# Patient Record
Sex: Male | Born: 1970
Health system: Southern US, Community
[De-identification: ages and names within clinical notes are randomized; demographics above are authoritative.]

## PROBLEM LIST (undated history)

## (undated) DIAGNOSIS — R011 Cardiac murmur, unspecified: Secondary | ICD-10-CM

## (undated) DIAGNOSIS — I10 Essential (primary) hypertension: Secondary | ICD-10-CM

## (undated) DIAGNOSIS — K219 Gastro-esophageal reflux disease without esophagitis: Secondary | ICD-10-CM

## (undated) DIAGNOSIS — T7840XA Allergy, unspecified, initial encounter: Secondary | ICD-10-CM

## (undated) HISTORY — DX: Allergy, unspecified, initial encounter: T78.40XA

---

## 2007-12-12 HISTORY — PX: COLONOSCOPY: SHX5424

## 2007-12-12 HISTORY — PX: UPPER GASTROINTESTINAL ENDOSCOPY: SHX188

## 2007-12-12 LAB — HM COLONOSCOPY

## 2011-04-26 ENCOUNTER — Ambulatory Visit: Payer: Self-pay | Admitting: Family Medicine

## 2012-12-26 LAB — LIPID PANEL
Cholesterol: 183 mg/dL (ref 0–200)
HDL: 46 mg/dL (ref 35–70)
LDL Cholesterol: 111 mg/dL
Triglycerides: 127 mg/dL (ref 40–160)

## 2014-07-12 LAB — PSA: PSA: 1

## 2014-07-12 LAB — TSH: TSH: 1.16 u[IU]/mL (ref <=5.90)

## 2015-04-20 ENCOUNTER — Encounter: Payer: Self-pay | Admitting: Internal Medicine

## 2015-04-20 DIAGNOSIS — K219 Gastro-esophageal reflux disease without esophagitis: Secondary | ICD-10-CM | POA: Insufficient documentation

## 2015-04-20 DIAGNOSIS — T7840XA Allergy, unspecified, initial encounter: Secondary | ICD-10-CM | POA: Insufficient documentation

## 2015-04-20 DIAGNOSIS — K589 Irritable bowel syndrome without diarrhea: Secondary | ICD-10-CM | POA: Insufficient documentation

## 2015-04-20 DIAGNOSIS — J3089 Other allergic rhinitis: Secondary | ICD-10-CM | POA: Insufficient documentation

## 2015-04-20 DIAGNOSIS — I10 Essential (primary) hypertension: Secondary | ICD-10-CM | POA: Insufficient documentation

## 2015-07-10 ENCOUNTER — Other Ambulatory Visit: Payer: Self-pay | Admitting: Internal Medicine

## 2015-07-16 ENCOUNTER — Other Ambulatory Visit: Payer: Self-pay | Admitting: Internal Medicine

## 2015-08-13 ENCOUNTER — Other Ambulatory Visit: Payer: Self-pay | Admitting: Internal Medicine

## 2015-12-01 ENCOUNTER — Other Ambulatory Visit: Payer: Self-pay | Admitting: Internal Medicine

## 2015-12-08 NOTE — Telephone Encounter (Signed)
pts coming in on 1/5

## 2015-12-15 ENCOUNTER — Ambulatory Visit: Payer: Self-pay | Admitting: Internal Medicine

## 2015-12-22 ENCOUNTER — Ambulatory Visit (INDEPENDENT_AMBULATORY_CARE_PROVIDER_SITE_OTHER): Payer: BLUE CROSS/BLUE SHIELD | Admitting: Internal Medicine

## 2015-12-22 ENCOUNTER — Encounter: Payer: Self-pay | Admitting: Internal Medicine

## 2015-12-22 VITALS — BP 136/82 | HR 88 | Ht 68.0 in | Wt 185.8 lb

## 2015-12-22 DIAGNOSIS — I10 Essential (primary) hypertension: Secondary | ICD-10-CM

## 2015-12-22 DIAGNOSIS — K219 Gastro-esophageal reflux disease without esophagitis: Secondary | ICD-10-CM | POA: Diagnosis not present

## 2015-12-22 MED ORDER — FLUTICASONE PROPIONATE 50 MCG/ACT NA SUSP: 2.0000 | Freq: Every day | NASAL | Status: DC

## 2015-12-22 MED ORDER — LOSARTAN POTASSIUM 50 MG PO TABS: 50.0000 mg | ORAL_TABLET | Freq: Every day | ORAL | Status: DC

## 2015-12-22 NOTE — Progress Notes (Signed)
Date:  12/22/2015   Name:  Jacob Blair   DOB:  1971-11-24   MRN:  [id number]   Chief Complaint: Follow-up and Hypertension Hypertension This is a chronic problem. The current episode started more than 1 year ago. The problem is unchanged. The problem is controlled. Pertinent negatives include no chest pain, headaches, palpitations or shortness of breath. Past treatments include angiotensin blockers and diuretics. The current treatment provides significant improvement. There are no compliance problems.    He checks his blood pressures intermittently.  He has a wellness visit at work yearly with labs.  That was done this past summer and was normal along with a normal blood pressure.  He feels generally well with no physical complaints. Reflux - symptoms are well controlled on daily proton pump inhibitor. He denies difficulty swallowing, heartburn, change in bowel habits or blood in the stool.  Review of Systems  Constitutional: Negative for fatigue.  Eyes: Negative for visual disturbance.  Respiratory: Negative for cough, chest tightness and shortness of breath.   Cardiovascular: Negative for chest pain, palpitations and leg swelling.  Gastrointestinal: Negative for abdominal pain and blood in stool.  Genitourinary: Negative for dysuria.  Musculoskeletal: Negative for arthralgias.  Allergic/Immunologic: Positive for environmental allergies.  Neurological: Negative for numbness and headaches.    Patient Active Problem List   Diagnosis Date Noted  . Allergic state 04/20/2015  . Essential (primary) hypertension 04/20/2015  . Gastro-esophageal reflux disease without esophagitis 04/20/2015  . Adaptive colitis 04/20/2015    Prior to Admission medications   Medication Sig Start Date End Date Taking? Authorizing Provider  Cetirizine HCl 10 MG CAPS Take 1 capsule by mouth daily.   Yes Historical Provider, MD  fluticasone (FLONASE) 50 MCG/ACT nasal spray Place 2 sprays into the nose  daily. 12/04/14  Yes Historical Provider, MD  hydrochlorothiazide (HYDRODIURIL) 25 MG tablet TAKE 1 TABLET BY MOUTH EVERY DAY 08/14/15  Yes Glean Hess, MD  losartan (COZAAR) 50 MG tablet TAKE 1 TABLET BY MOUTH DAILY 12/02/15  Yes Glean Hess, MD  Omeprazole 20 MG TBEC Take 1 tablet by mouth daily.   Yes Historical Provider, MD    Allergies  Allergen Reactions  . Ace Inhibitors Cough  . Peanut-Containing Drug Products   . Shellfish Allergy   . Amlodipine Rash    Past Surgical History  Procedure Laterality Date  . Upper gastrointestinal endoscopy  010209    small esophagus    Social History  Substance Use Topics  . Smoking status: Never Smoker   . Smokeless tobacco: Current User    Types: Chew  . Alcohol Use: None    Medication list has been reviewed and updated.   Physical Exam  Constitutional: He is oriented to person, place, and time. He appears well-developed and well-nourished. No distress.  HENT:  Head: Normocephalic and atraumatic.  Eyes: Conjunctivae are normal. Right eye exhibits no discharge. Left eye exhibits no discharge. No scleral icterus.  Neck: Normal range of motion. Neck supple.  Cardiovascular: Normal rate, regular rhythm and normal heart sounds.   Pulmonary/Chest: Effort normal and breath sounds normal. No respiratory distress.  Musculoskeletal: Normal range of motion. He exhibits no edema or tenderness.  Neurological: He is alert and oriented to person, place, and time.  Skin: Skin is warm and dry. No rash noted.  Psychiatric: He has a normal mood and affect. His behavior is normal. Thought content normal.    BP 136/82 mmHg  Pulse 88  Ht 5'  8" (1.727 m)  Wt 185 lb 12.8 oz (84.278 kg)  BMI 28.26 kg/m2  Assessment and Plan: 1. Essential (primary) hypertension Well-controlled - losartan (COZAAR) 50 MG tablet; Take 1 tablet (50 mg total) by mouth daily.  Dispense: 30 tablet; Refill: 5  2. Gastro-esophageal reflux disease without  esophagitis Stable on daily proton pump inhibitor   Halina Maidens, MD [facility]  12/22/2015

## 2016-01-29 ENCOUNTER — Other Ambulatory Visit: Payer: Self-pay | Admitting: Internal Medicine

## 2016-06-19 ENCOUNTER — Other Ambulatory Visit: Payer: Self-pay | Admitting: Internal Medicine

## 2016-06-20 ENCOUNTER — Encounter: Payer: Self-pay | Admitting: Internal Medicine

## 2016-06-26 DIAGNOSIS — H40003 Preglaucoma, unspecified, bilateral: Secondary | ICD-10-CM | POA: Diagnosis not present

## 2016-07-05 DIAGNOSIS — H40003 Preglaucoma, unspecified, bilateral: Secondary | ICD-10-CM | POA: Diagnosis not present

## 2016-07-23 ENCOUNTER — Ambulatory Visit (INDEPENDENT_AMBULATORY_CARE_PROVIDER_SITE_OTHER): Payer: BLUE CROSS/BLUE SHIELD | Admitting: Internal Medicine

## 2016-07-23 ENCOUNTER — Encounter: Payer: Self-pay | Admitting: Internal Medicine

## 2016-07-23 VITALS — BP 134/72 | HR 71 | Resp 16 | Ht 68.0 in | Wt 189.0 lb

## 2016-07-23 DIAGNOSIS — Z0000 Encounter for general adult medical examination without abnormal findings: Secondary | ICD-10-CM | POA: Diagnosis not present

## 2016-07-23 DIAGNOSIS — R5383 Other fatigue: Secondary | ICD-10-CM | POA: Insufficient documentation

## 2016-07-23 DIAGNOSIS — I10 Essential (primary) hypertension: Secondary | ICD-10-CM | POA: Diagnosis not present

## 2016-07-23 DIAGNOSIS — K219 Gastro-esophageal reflux disease without esophagitis: Secondary | ICD-10-CM

## 2016-07-23 DIAGNOSIS — Z125 Encounter for screening for malignant neoplasm of prostate: Secondary | ICD-10-CM | POA: Diagnosis not present

## 2016-07-23 LAB — POCT URINALYSIS DIPSTICK
Bilirubin, UA: NEGATIVE
Blood, UA: NEGATIVE
Glucose, UA: NEGATIVE
Ketones, UA: NEGATIVE
Leukocytes, UA: NEGATIVE
Nitrite, UA: NEGATIVE
Protein, UA: NEGATIVE
Spec Grav, UA: 1.01
pH, UA: 6

## 2016-07-23 NOTE — Patient Instructions (Signed)
DASH Eating Plan  DASH stands for "Dietary Approaches to Stop Hypertension." The DASH eating plan is a healthy eating plan that has been shown to reduce high blood pressure (hypertension). Additional health benefits may include reducing the risk of type 2 diabetes mellitus, heart disease, and stroke. The DASH eating plan may also help with weight loss.  WHAT DO I NEED TO KNOW ABOUT THE DASH EATING PLAN?  For the DASH eating plan, you will follow these general guidelines:  · Choose foods with a percent daily value for sodium of less than 5% (as listed on the food label).  · Use salt-free seasonings or herbs instead of table salt or sea salt.  · Check with your health care provider or pharmacist before using salt substitutes.  · Eat lower-sodium products, often labeled as "lower sodium" or "no salt added."  · Eat fresh foods.  · Eat more vegetables, fruits, and low-fat dairy products.  · Choose whole grains. Look for the word "whole" as the first word in the ingredient list.  · Choose fish and skinless chicken or turkey more often than red meat. Limit fish, poultry, and meat to 6 oz (170 g) each day.  · Limit sweets, desserts, sugars, and sugary drinks.  · Choose heart-healthy fats.  · Limit cheese to 1 oz (28 g) per day.  · Eat more home-cooked food and less restaurant, buffet, and fast food.  · Limit fried foods.  · Cook foods using methods other than frying.  · Limit canned vegetables. If you do use them, rinse them well to decrease the sodium.  · When eating at a restaurant, ask that your food be prepared with less salt, or no salt if possible.  WHAT FOODS CAN I EAT?  Seek help from a dietitian for individual calorie needs.  Grains  Whole grain or whole wheat bread. Brown rice. Whole grain or whole wheat pasta. Quinoa, bulgur, and whole grain cereals. Low-sodium cereals. Corn or whole wheat flour tortillas. Whole grain cornbread. Whole grain crackers. Low-sodium crackers.  Vegetables  Fresh or frozen vegetables  (raw, steamed, roasted, or grilled). Low-sodium or reduced-sodium tomato and vegetable juices. Low-sodium or reduced-sodium tomato sauce and paste. Low-sodium or reduced-sodium canned vegetables.   Fruits  All fresh, canned (in natural juice), or frozen fruits.  Meat and Other Protein Products  Ground beef (85% or leaner), grass-fed beef, or beef trimmed of fat. Skinless chicken or turkey. Ground chicken or turkey. Pork trimmed of fat. All fish and seafood. Eggs. Dried beans, peas, or lentils. Unsalted nuts and seeds. Unsalted canned beans.  Dairy  Low-fat dairy products, such as skim or 1% milk, 2% or reduced-fat cheeses, low-fat ricotta or cottage cheese, or plain low-fat yogurt. Low-sodium or reduced-sodium cheeses.  Fats and Oils  Tub margarines without trans fats. Light or reduced-fat mayonnaise and salad dressings (reduced sodium). Avocado. Safflower, olive, or canola oils. Natural peanut or almond butter.  Other  Unsalted popcorn and pretzels.  The items listed above may not be a complete list of recommended foods or beverages. Contact your dietitian for more options.  WHAT FOODS ARE NOT RECOMMENDED?  Grains  White bread. White pasta. White rice. Refined cornbread. Bagels and croissants. Crackers that contain trans fat.  Vegetables  Creamed or fried vegetables. Vegetables in a cheese sauce. Regular canned vegetables. Regular canned tomato sauce and paste. Regular tomato and vegetable juices.  Fruits  Dried fruits. Canned fruit in light or heavy syrup. Fruit juice.  Meat and Other Protein   Products  Fatty cuts of meat. Ribs, chicken wings, bacon, sausage, bologna, salami, chitterlings, fatback, hot dogs, bratwurst, and packaged luncheon meats. Salted nuts and seeds. Canned beans with salt.  Dairy  Whole or 2% milk, cream, half-and-half, and cream cheese. Whole-fat or sweetened yogurt. Full-fat cheeses or blue cheese. Nondairy creamers and whipped toppings. Processed cheese, cheese spreads, or cheese  curds.  Condiments  Onion and garlic salt, seasoned salt, table salt, and sea salt. Canned and packaged gravies. Worcestershire sauce. Tartar sauce. Barbecue sauce. Teriyaki sauce. Soy sauce, including reduced sodium. Steak sauce. Fish sauce. Oyster sauce. Cocktail sauce. Horseradish. Ketchup and mustard. Meat flavorings and tenderizers. Bouillon cubes. Hot sauce. Tabasco sauce. Marinades. Taco seasonings. Relishes.  Fats and Oils  Butter, stick margarine, lard, shortening, ghee, and bacon fat. Coconut, palm kernel, or palm oils. Regular salad dressings.  Other  Pickles and olives. Salted popcorn and pretzels.  The items listed above may not be a complete list of foods and beverages to avoid. Contact your dietitian for more information.  WHERE CAN I FIND MORE INFORMATION?  National Heart, Lung, and Blood Institute: www.nhlbi.nih.gov/health/health-topics/topics/dash/     This information is not intended to replace advice given to you by your health care provider. Make sure you discuss any questions you have with your health care provider.     Document Released: 11/15/2011 Document Revised: 12/17/2014 Document Reviewed: 09/30/2013  Elsevier Interactive Patient Education ©2016 Elsevier Inc[employer]

## 2016-07-23 NOTE — Progress Notes (Signed)
Date:  07/23/2016   Name:  Jacob Blair   DOB:  September 26, 1971   MRN:  [id number]   Chief Complaint: Annual Exam; Fatigue; and Hypertension Jacob Blair is a 45 y.o. male who presents today for his Complete Annual Exam. He feels well. He reports exercising regularly. He reports he is sleeping well.   Hypertension  This is a chronic problem. The current episode started more than 1 year ago. The problem is unchanged. The problem is controlled. Pertinent negatives include no chest pain, headaches, palpitations or shortness of breath. Past treatments include angiotensin blockers. The current treatment provides significant improvement.      Review of Systems  Constitutional: Positive for activity change (decreased stamina without other sx). Negative for appetite change, chills, diaphoresis, fatigue and unexpected weight change.  HENT: Negative for hearing loss, tinnitus, trouble swallowing and voice change.   Eyes: Negative for visual disturbance.  Respiratory: Negative for choking, shortness of breath and wheezing.   Cardiovascular: Negative for chest pain, palpitations and leg swelling.  Gastrointestinal: Positive for constipation and diarrhea. Negative for abdominal pain and blood in stool.  Genitourinary: Negative for decreased urine volume, difficulty urinating, dysuria, frequency and urgency.  Musculoskeletal: Negative for arthralgias, back pain and myalgias.  Skin: Negative for color change and rash.  Neurological: Negative for dizziness, syncope, weakness, light-headedness and headaches.  Hematological: Negative for adenopathy.  Psychiatric/Behavioral: Negative for dysphoric mood and sleep disturbance. The patient is not nervous/anxious.     Patient Active Problem List   Diagnosis Date Noted  . Allergic state 04/20/2015  . Essential (primary) hypertension 04/20/2015  . Gastro-esophageal reflux disease without esophagitis 04/20/2015  . Adaptive colitis 04/20/2015    Prior to  Admission medications   Medication Sig Start Date End Date Taking? Authorizing Provider  fluticasone (FLONASE) 50 MCG/ACT nasal spray Place 2 sprays into both nostrils daily. 12/22/15  Yes Glean Hess, MD  hydrochlorothiazide (HYDRODIURIL) 25 MG tablet TAKE 1 TABLET BY MOUTH EVERY DAY 01/29/16  Yes Glean Hess, MD  losartan (COZAAR) 50 MG tablet TAKE 1 TABLET(50 MG) BY MOUTH DAILY 06/19/16  Yes Glean Hess, MD  Omeprazole 20 MG TBEC Take 1 tablet by mouth daily.   Yes Historical Provider, MD  Cetirizine HCl 10 MG CAPS Take 1 capsule by mouth daily.    Historical Provider, MD    Allergies  Allergen Reactions  . Ace Inhibitors Cough  . Peanut-Containing Drug Products   . Shellfish Allergy   . Amlodipine Rash    Past Surgical History:  Procedure Laterality Date  . UPPER GASTROINTESTINAL ENDOSCOPY  010209   small esophagus    Social History  Substance Use Topics  . Smoking status: Never Smoker  . Smokeless tobacco: Current User    Types: Chew  . Alcohol use Not on file     Medication list has been reviewed and updated.   Physical Exam  Constitutional: He is oriented to person, place, and time. He appears well-developed and well-nourished.  HENT:  Head: Normocephalic.  Right Ear: Tympanic membrane, external ear and ear canal normal.  Left Ear: Tympanic membrane, external ear and ear canal normal.  Nose: Nose normal.  Mouth/Throat: Uvula is midline and oropharynx is clear and moist.  Eyes: Conjunctivae and EOM are normal. Pupils are equal, round, and reactive to light.  Neck: Normal range of motion. Neck supple. Carotid bruit is not present. No thyromegaly present.  Cardiovascular: Normal rate, regular rhythm, normal heart sounds and intact  distal pulses.   Pulmonary/Chest: Effort normal and breath sounds normal. No respiratory distress. He has no wheezes. Right breast exhibits no mass. Left breast exhibits no mass.  Abdominal: Soft. Normal appearance and bowel  sounds are normal. There is no hepatosplenomegaly. There is no tenderness.  Musculoskeletal: Normal range of motion. He exhibits no edema or tenderness.  Lymphadenopathy:    He has no cervical adenopathy.  Neurological: He is alert and oriented to person, place, and time. He has normal reflexes.  Skin: Skin is warm, dry and intact.  Psychiatric: He has a normal mood and affect. His speech is normal and behavior is normal. Judgment and thought content normal.  Nursing note and vitals reviewed.   BP 134/72   Pulse 71   Resp 16   Ht 5\' 8"  (1.727 m)   Wt 189 lb (85.7 kg)   SpO2 100%   BMI 28.74 kg/m   Assessment and Plan: 1. Annual physical exam Advise if medication is needed Recommend low carb diet to aid in weight loss/control - Lipid panel  2. Essential (primary) hypertension controlled - CBC with Differential/Platelet - Comprehensive metabolic panel - TSH - POCT urinalysis dipstick  3. Gastro-esophageal reflux disease without esophagitis Stable on PPI  4. Decreased stamina Check testosterone levels - Testosterone   Halina Maidens, MD [facility]  07/23/2016

## 2016-07-24 ENCOUNTER — Encounter: Payer: Self-pay | Admitting: Internal Medicine

## 2016-07-24 DIAGNOSIS — E785 Hyperlipidemia, unspecified: Secondary | ICD-10-CM | POA: Insufficient documentation

## 2016-07-24 DIAGNOSIS — E7800 Pure hypercholesterolemia, unspecified: Secondary | ICD-10-CM | POA: Insufficient documentation

## 2016-07-24 DIAGNOSIS — R7989 Other specified abnormal findings of blood chemistry: Secondary | ICD-10-CM | POA: Insufficient documentation

## 2016-07-24 LAB — COMPREHENSIVE METABOLIC PANEL
ALT: 34 IU/L (ref 0–44)
AST: 24 IU/L (ref 0–40)
Albumin/Globulin Ratio: 1.8 (ref 1.2–2.2)
Albumin: 4.8 g/dL (ref 3.5–5.5)
Alkaline Phosphatase: 81 IU/L (ref 39–117)
BUN/Creatinine Ratio: 16 (ref 9–20)
BUN: 14 mg/dL (ref 6–24)
Bilirubin Total: 0.7 mg/dL (ref 0.0–1.2)
CO2: 24 mmol/L (ref 18–29)
Calcium: 9.6 mg/dL (ref 8.7–10.2)
Chloride: 98 mmol/L (ref 96–106)
Creatinine, Ser: 0.89 mg/dL (ref 0.76–1.27)
GFR calc Af Amer: 119 mL/min/{1.73_m2} (ref >59)
GFR calc non Af Amer: 103 mL/min/{1.73_m2} (ref >59)
Globulin, Total: 2.7 g/dL (ref 1.5–4.5)
Glucose: 97 mg/dL (ref 65–99)
Potassium: 3.9 mmol/L (ref 3.5–5.2)
Sodium: 140 mmol/L (ref 134–144)
Total Protein: 7.5 g/dL (ref 6.0–8.5)

## 2016-07-24 LAB — LIPID PANEL
Chol/HDL Ratio: 3.7 ratio units (ref 0.0–5.0)
Cholesterol, Total: 208 mg/dL — ABNORMAL HIGH (ref 100–199)
HDL: 56 mg/dL (ref >39)
LDL Calculated: 131 mg/dL — ABNORMAL HIGH (ref 0–99)
Triglycerides: 106 mg/dL (ref 0–149)
VLDL Cholesterol Cal: 21 mg/dL (ref 5–40)

## 2016-07-24 LAB — PSA: Prostate Specific Ag, Serum: 1.2 ng/mL (ref 0.0–4.0)

## 2016-07-24 LAB — CBC WITH DIFFERENTIAL/PLATELET
Basophils Absolute: 0 10*3/uL (ref 0.0–0.2)
Basos: 0 %
EOS (ABSOLUTE): 0.2 10*3/uL (ref 0.0–0.4)
Eos: 2 %
Hematocrit: 40.8 % (ref 37.5–51.0)
Hemoglobin: 13.6 g/dL (ref 12.6–17.7)
Immature Grans (Abs): 0 10*3/uL (ref 0.0–0.1)
Immature Granulocytes: 0 %
Lymphocytes Absolute: 2 10*3/uL (ref 0.7–3.1)
Lymphs: 26 %
MCH: 30.5 pg (ref 26.6–33.0)
MCHC: 33.3 g/dL (ref 31.5–35.7)
MCV: 92 fL (ref 79–97)
Monocytes Absolute: 0.6 10*3/uL (ref 0.1–0.9)
Monocytes: 8 %
Neutrophils Absolute: 4.9 10*3/uL (ref 1.4–7.0)
Neutrophils: 64 %
Platelets: 333 10*3/uL (ref 150–379)
RBC: 4.46 x10E6/uL (ref 4.14–5.80)
RDW: 13.5 % (ref 12.3–15.4)
WBC: 7.6 10*3/uL (ref 3.4–10.8)

## 2016-07-24 LAB — TESTOSTERONE: Testosterone: 252 ng/dL — ABNORMAL LOW (ref 264–916)

## 2016-07-24 LAB — TSH: TSH: 1.4 u[IU]/mL (ref 0.450–4.500)

## 2016-07-25 ENCOUNTER — Other Ambulatory Visit: Payer: Self-pay | Admitting: Internal Medicine

## 2016-07-25 ENCOUNTER — Telehealth: Payer: Self-pay

## 2016-07-25 DIAGNOSIS — R7989 Other specified abnormal findings of blood chemistry: Secondary | ICD-10-CM

## 2016-07-25 NOTE — Telephone Encounter (Signed)
Patient wants to know if he can now do the 2nd lab required for his Testosterone supplement to be covered? aIn note you mention IF he wants to continue these we will do lab but he has never started it.

## 2016-07-25 NOTE — Telephone Encounter (Signed)
I will order a repeat testosterone level.  He should do it before 10 AM.

## 2016-07-26 ENCOUNTER — Telehealth: Payer: Self-pay

## 2016-08-01 ENCOUNTER — Other Ambulatory Visit: Payer: Self-pay | Admitting: Internal Medicine

## 2016-08-01 DIAGNOSIS — R7989 Other specified abnormal findings of blood chemistry: Secondary | ICD-10-CM

## 2016-08-01 DIAGNOSIS — E291 Testicular hypofunction: Secondary | ICD-10-CM | POA: Diagnosis not present

## 2016-08-01 NOTE — Telephone Encounter (Signed)
complete

## 2016-08-02 LAB — TESTOSTERONE: Testosterone: 248 ng/dL — ABNORMAL LOW (ref 264–916)

## 2016-08-07 ENCOUNTER — Ambulatory Visit (INDEPENDENT_AMBULATORY_CARE_PROVIDER_SITE_OTHER): Payer: BLUE CROSS/BLUE SHIELD | Admitting: Internal Medicine

## 2016-08-07 ENCOUNTER — Encounter: Payer: Self-pay | Admitting: Internal Medicine

## 2016-08-07 ENCOUNTER — Other Ambulatory Visit: Payer: Self-pay | Admitting: Internal Medicine

## 2016-08-07 VITALS — BP 136/82 | HR 72 | Wt 184.2 lb

## 2016-08-07 DIAGNOSIS — E291 Testicular hypofunction: Secondary | ICD-10-CM

## 2016-08-07 DIAGNOSIS — R7989 Other specified abnormal findings of blood chemistry: Secondary | ICD-10-CM

## 2016-08-07 MED ORDER — TESTOSTERONE 50 MG/5GM (1%) TD GEL: 5.0000 g | Freq: Every day | TRANSDERMAL | 5 refills | Status: DC

## 2016-08-07 MED ORDER — TESTOSTERONE 20.25 MG/ACT (1.62%) TD GEL: 2.0000 "application " | Freq: Every day | TRANSDERMAL | 5 refills | Status: DC

## 2016-08-07 NOTE — Progress Notes (Signed)
    Date:  08/07/2016   Name:  Jacob Blair   DOB:  [birth year]   MRN:  [id number]   Chief Complaint: No chief complaint on file. Low testosterone - some low energy and decreased stamina but no depression or decreased libido.  Testosterone levels were low x 2.  He would like to try supplementation.  Last lipids, CBC and PSA were normal.   Review of Systems  Constitutional: Negative for unexpected weight change (has lost 5 lbs with effort).  Respiratory: Negative for chest tightness and shortness of breath.   Cardiovascular: Negative for chest pain, palpitations and leg swelling.  Neurological: Negative for dizziness and headaches.  Hematological: Does not bruise/bleed easily.  All other systems reviewed and are negative.   Patient Active Problem List   Diagnosis Date Noted  . Hyperlipidemia, mild 07/24/2016  . Low serum testosterone level 07/24/2016  . Decreased stamina 07/23/2016  . Allergic state 04/20/2015  . Essential (primary) hypertension 04/20/2015  . Gastro-esophageal reflux disease without esophagitis 04/20/2015  . Adaptive colitis 04/20/2015    Prior to Admission medications   Medication Sig Start Date End Date Taking? Authorizing Provider  Cetirizine HCl 10 MG CAPS Take 1 capsule by mouth daily.   Yes Historical Provider, MD  fluticasone (FLONASE) 50 MCG/ACT nasal spray Place 2 sprays into both nostrils daily. 12/22/15  Yes Glean Hess, MD  hydrochlorothiazide (HYDRODIURIL) 25 MG tablet TAKE 1 TABLET BY MOUTH EVERY DAY 01/29/16  Yes Glean Hess, MD  losartan (COZAAR) 50 MG tablet TAKE 1 TABLET(50 MG) BY MOUTH DAILY 06/19/16  Yes Glean Hess, MD  Omeprazole 20 MG TBEC Take 1 tablet by mouth daily.   Yes Historical Provider, MD    Allergies  Allergen Reactions  . Ace Inhibitors Cough  . Peanut-Containing Drug Products   . Shellfish Allergy   . Amlodipine Rash    Past Surgical History:  Procedure Laterality Date  . UPPER GASTROINTESTINAL ENDOSCOPY   010209   small esophagus    Social History  Substance Use Topics  . Smoking status: Never Smoker  . Smokeless tobacco: Current User    Types: Chew  . Alcohol use Not on file     Medication list has been reviewed and updated.   Physical Exam  Constitutional: He is oriented to person, place, and time. He appears well-developed. No distress.  HENT:  Head: Normocephalic and atraumatic.  Neck: Normal range of motion. Neck supple.  Cardiovascular: Normal rate, regular rhythm and normal heart sounds.   Pulmonary/Chest: Effort normal and breath sounds normal. No respiratory distress.  Musculoskeletal: Normal range of motion.  Neurological: He is alert and oriented to person, place, and time.  Skin: Skin is warm and dry. No rash noted.  Psychiatric: He has a normal mood and affect. His behavior is normal. Thought content normal.  Nursing note and vitals reviewed.   BP 136/82   Pulse 72   Wt 184 lb 3.2 oz (83.6 kg)   BMI 28.01 kg/m   Assessment and Plan: 1. Low serum testosterone level Begin androgel pump - 2 applications daily Expect PA F/u 4 months - Testosterone 20.25 MG/ACT (1.62%) GEL; Place 2 application onto the skin daily.  Dispense: 150 g; Refill: [place], MD [facility]  08/07/2016

## 2016-08-15 ENCOUNTER — Encounter: Payer: Self-pay | Admitting: Internal Medicine

## 2016-08-16 ENCOUNTER — Other Ambulatory Visit: Payer: Self-pay

## 2016-10-15 ENCOUNTER — Other Ambulatory Visit: Payer: Self-pay | Admitting: Internal Medicine

## 2016-10-15 ENCOUNTER — Telehealth: Payer: Self-pay

## 2016-10-15 MED ORDER — OSELTAMIVIR PHOSPHATE 75 MG PO CAPS: 75.0000 mg | ORAL_CAPSULE | Freq: Every day | ORAL | 0 refills | Status: DC

## 2016-10-15 NOTE — Telephone Encounter (Signed)
Advised 

## 2016-10-15 NOTE — Telephone Encounter (Signed)
Wife DX Flu in [place] yesterday. They have a trip planned later in week and advised him to call us for Tamiflu incase he gets it later. Wife is 48 hours sick. Would like a call back if Tamiflu can be sent. No symptoms yet.

## 2016-10-15 NOTE — Telephone Encounter (Signed)
Sent to pharmacy 

## 2016-11-06 ENCOUNTER — Encounter: Payer: Self-pay | Admitting: Internal Medicine

## 2016-12-07 ENCOUNTER — Ambulatory Visit (INDEPENDENT_AMBULATORY_CARE_PROVIDER_SITE_OTHER): Payer: BLUE CROSS/BLUE SHIELD | Admitting: Internal Medicine

## 2016-12-07 ENCOUNTER — Encounter: Payer: Self-pay | Admitting: Internal Medicine

## 2016-12-07 VITALS — BP 130/84 | HR 76 | Temp 98.7°F | Ht 68.0 in | Wt 181.0 lb

## 2016-12-07 DIAGNOSIS — E291 Testicular hypofunction: Secondary | ICD-10-CM

## 2016-12-07 DIAGNOSIS — I10 Essential (primary) hypertension: Secondary | ICD-10-CM

## 2016-12-07 DIAGNOSIS — R7989 Other specified abnormal findings of blood chemistry: Secondary | ICD-10-CM

## 2016-12-07 NOTE — Progress Notes (Signed)
Date:  12/07/2016   Name:  Jacob Blair   DOB:  March 17, 1971   MRN:  [id number]   Chief Complaint: No chief complaint on file. Follow up on testosterone. Patient did not continue testosterone after the first 2 months. He did not think that there was any benefit to its use and as of the first of the year have to pay full price. His last use of testosterone was about 3 weeks ago. He has been working on his diet and has lost some weight. He feels a little better with more energy and he is not sure if he can attribute this to the improved diet and weight loss or to testosterone. He is considering vasectomy and wonders about Urology recommendations and if he needs a referral.    Review of Systems  Constitutional: Negative for diaphoresis, fatigue, fever and unexpected weight change.  Respiratory: Negative for cough, chest tightness and shortness of breath.   Cardiovascular: Negative for chest pain, palpitations and leg swelling.  Gastrointestinal: Negative for abdominal pain.  Genitourinary: Negative for frequency and urgency.    Patient Active Problem List   Diagnosis Date Noted  . Hyperlipidemia, mild 07/24/2016  . Low serum testosterone level 07/24/2016  . Decreased stamina 07/23/2016  . Allergic state 04/20/2015  . Essential (primary) hypertension 04/20/2015  . Gastro-esophageal reflux disease without esophagitis 04/20/2015  . Adaptive colitis 04/20/2015    Prior to Admission medications   Medication Sig Start Date End Date Taking? Authorizing Provider  Cetirizine HCl 10 MG CAPS Take 1 capsule by mouth daily.    Historical Provider, MD  fluticasone (FLONASE) 50 MCG/ACT nasal spray Place 2 sprays into both nostrils daily. 12/22/15   Glean Hess, MD  hydrochlorothiazide (HYDRODIURIL) 25 MG tablet TAKE 1 TABLET BY MOUTH EVERY DAY 01/29/16   Glean Hess, MD  losartan (COZAAR) 50 MG tablet TAKE 1 TABLET(50 MG) BY MOUTH DAILY 06/19/16   Glean Hess, MD  Omeprazole 20 MG  TBEC Take 1 tablet by mouth daily.    Historical Provider, MD  testosterone (TESTIM) 50 MG/5GM (1%) GEL Place 5 g onto the skin daily. Patient not taking: Reported on 12/07/2016 08/07/16   Glean Hess, MD    Allergies  Allergen Reactions  . Ace Inhibitors Cough  . Peanut-Containing Drug Products   . Shellfish Allergy   . Amlodipine Rash    Past Surgical History:  Procedure Laterality Date  . UPPER GASTROINTESTINAL ENDOSCOPY  010209   small esophagus    Social History  Substance Use Topics  . Smoking status: Never Smoker  . Smokeless tobacco: Current User    Types: Chew  . Alcohol use Not on file     Medication list has been reviewed and updated.   Physical Exam  Constitutional: He is oriented to person, place, and time. He appears well-developed. No distress.  HENT:  Head: Normocephalic and atraumatic.  Pulmonary/Chest: Effort normal. No respiratory distress.  Musculoskeletal: Normal range of motion.  Neurological: He is alert and oriented to person, place, and time.  Skin: Skin is warm and dry. No rash noted.  Psychiatric: He has a normal mood and affect. His behavior is normal. Thought content normal.  Nursing note and vitals reviewed.   BP 130/84   Pulse 76   Temp 98.7 F (37.1 C)   Ht 5\' 8"  (1.727 m)   Wt 181 lb (82.1 kg)   SpO2 99%   BMI 27.52 kg/m   Assessment and Plan:  1. Low serum testosterone level Consider restarting testosterone at 1.5 packets per day and recheck labs in 2 months (depending on cost)  2. Essential (primary) hypertension Controlled, continue current medication.   Halina Maidens, MD [facility]  12/07/2016

## 2017-01-09 ENCOUNTER — Other Ambulatory Visit: Payer: Self-pay | Admitting: Internal Medicine

## 2017-03-27 DIAGNOSIS — L282 Other prurigo: Secondary | ICD-10-CM | POA: Diagnosis not present

## 2017-07-10 ENCOUNTER — Other Ambulatory Visit: Payer: Self-pay | Admitting: Internal Medicine

## 2017-07-24 ENCOUNTER — Encounter: Payer: Self-pay | Admitting: Internal Medicine

## 2017-07-27 ENCOUNTER — Ambulatory Visit
Admission: EM | Admit: 2017-07-27 | Discharge: 2017-07-27 | Disposition: A | Discharge status: Home / Self Care | Payer: BLUE CROSS/BLUE SHIELD | Attending: Family Medicine | Admitting: Family Medicine

## 2017-07-27 ENCOUNTER — Encounter: Payer: Self-pay | Admitting: Gynecology

## 2017-07-27 DIAGNOSIS — R1031 Right lower quadrant pain: Secondary | ICD-10-CM

## 2017-07-27 HISTORY — DX: Essential (primary) hypertension: I10

## 2017-07-27 LAB — URINALYSIS, COMPLETE (UACMP) WITH MICROSCOPIC
Bacteria, UA: NONE SEEN
Bilirubin Urine: NEGATIVE
Glucose, UA: NEGATIVE mg/dL
Hgb urine dipstick: NEGATIVE
Ketones, ur: NEGATIVE mg/dL
Leukocytes, UA: NEGATIVE
Nitrite: NEGATIVE
Protein, ur: NEGATIVE mg/dL
Specific Gravity, Urine: 1.02 (ref 1.005–1.030)
WBC, UA: NONE SEEN WBC/hpf (ref 0–5)
pH: 7 (ref 5.0–8.0)

## 2017-07-27 LAB — CBC
HCT: 43.9 % (ref 40.0–52.0)
Hemoglobin: 15 g/dL (ref 13.0–18.0)
MCH: 31.4 pg (ref 26.0–34.0)
MCHC: 34.3 g/dL (ref 32.0–36.0)
MCV: 91.7 fL (ref 80.0–100.0)
Platelets: 307 10*3/uL (ref 150–440)
RBC: 4.79 MIL/uL (ref 4.40–5.90)
RDW: 13 % (ref 11.5–14.5)
WBC: 8.8 10*3/uL (ref 3.8–10.6)

## 2017-07-27 LAB — BASIC METABOLIC PANEL
Anion gap: 8 (ref 5–15)
BUN: 13 mg/dL (ref 6–20)
CO2: 27 mmol/L (ref 22–32)
Calcium: 10.2 mg/dL (ref 8.9–10.3)
Chloride: 102 mmol/L (ref 101–111)
Creatinine, Ser: 0.86 mg/dL (ref 0.61–1.24)
GFR calc Af Amer: >60 mL/min (ref >60)
GFR calc non Af Amer: >60 mL/min (ref >60)
Glucose, Bld: 93 mg/dL (ref 65–99)
Potassium: 3.9 mmol/L (ref 3.5–5.1)
Sodium: 137 mmol/L (ref 135–145)

## 2017-07-27 NOTE — ED Triage Notes (Signed)
Patient c/o nagging pain at RLQ on and off x 2 weeks.

## 2017-07-27 NOTE — ED Provider Notes (Signed)
[facility]    CSN: [id number] Arrival date & time: 07/27/17  1419     History   Chief Complaint Chief Complaint  Patient presents with  . Abdominal Pain    HPI Jacob Blair is a 46 y.o. male persistence to the urgent care facility for evaluation of right lower quadrant abdominal pain. His pain isn't present for 2 weeks he describes a mild dull ache in the right lower quadrant that he describes as pressure. Pain is been off and on for 2 weeks. No issues with bowel or bladder. Normal bowel movements. Today, patient experienced some mild sharp pain with walking. Pain is currently 0 out of 10. He denies any back pain or radiation of pain. No fevers, nausea, vomiting. No blood in his stool. He is currently tolerating by mouth well. He has a history of IBS that controls this with his diet. Last saw gastroenterology 9 years ago.    HPI  Past Medical History:  Diagnosis Date  . Hypertension     Patient Active Problem List   Diagnosis Date Noted  . Hyperlipidemia, mild 07/24/2016  . Low serum testosterone level 07/24/2016  . Decreased stamina 07/23/2016  . Allergic state 04/20/2015  . Essential (primary) hypertension 04/20/2015  . Gastro-esophageal reflux disease without esophagitis 04/20/2015  . Adaptive colitis 04/20/2015    Past Surgical History:  Procedure Laterality Date  . UPPER GASTROINTESTINAL ENDOSCOPY  010209   small esophagus       Home Medications    Prior to Admission medications   Medication Sig Start Date End Date Taking? Authorizing Provider  Cetirizine HCl 10 MG CAPS Take 1 capsule by mouth daily.   Yes [provider]  fluticasone (FLONASE) 50 MCG/ACT nasal spray Place 2 sprays into both nostrils daily. 12/22/15  Yes Glean Hess, MD  hydrochlorothiazide (HYDRODIURIL) 25 MG tablet TAKE 1 TABLET BY MOUTH EVERY DAY 01/10/17  Yes Glean Hess, MD  losartan (COZAAR) 50 MG tablet TAKE 1 TABLET(50 MG) BY MOUTH DAILY 07/11/17  Yes  Glean Hess, MD  Omeprazole 20 MG TBEC Take 1 tablet by mouth daily.   Yes [provider]  testosterone (TESTIM) 50 MG/5GM (1%) GEL Place 5 g onto the skin daily. Patient not taking: Reported on 12/07/2016 08/07/16   Glean Hess, MD    Family History Family History  Problem Relation Age of Onset  . Adopted: Yes  . Family history unknown: Yes    Social History Social History  Substance Use Topics  . Smoking status: Never Smoker  . Smokeless tobacco: Current User    Types: Chew  . Alcohol use 0.0 oz/week     Allergies   Ace inhibitors; Peanut-containing drug products; Shellfish allergy; and Amlodipine   Review of Systems Review of Systems  Constitutional: Negative for fever.  HENT: Negative for trouble swallowing.   Respiratory: Negative for shortness of breath.   Cardiovascular: Negative for chest pain.  Gastrointestinal: Positive for abdominal pain. Negative for abdominal distention, constipation, diarrhea, nausea, rectal pain and vomiting.  Genitourinary: Negative for difficulty urinating, dysuria and frequency.  Skin: Negative for rash.     Physical Exam Triage Vital Signs ED Triage Vitals  Enc Vitals Group     BP 07/27/17 1431 (!) 154/85     Pulse Rate 07/27/17 1431 82     Resp 07/27/17 1431 16     Temp 07/27/17 1431 98.4 F (36.9 C)     Temp Source 07/27/17 1431 Oral  SpO2 07/27/17 1431 100 %     Weight 07/27/17 1435 179 lb (81.2 kg)     Height 07/27/17 1435 5\' 8"  (1.727 m)     Head Circumference --      Peak Flow --      Pain Score 07/27/17 1435 3     Pain Loc --      Pain Edu? --      Excl. in [place]? --    No data found.   Updated Vital Signs BP (!) 154/85 (BP Location: Left Arm)   Pulse 82   Temp 98.4 F (36.9 C) (Oral)   Resp 16   Ht 5\' 8"  (1.727 m)   Wt 179 lb (81.2 kg)   SpO2 100%   BMI 27.22 kg/m   Visual Acuity Right Eye Distance:   Left Eye Distance:   Bilateral Distance:    Right Eye Near:   Left Eye  Near:    Bilateral Near:     Physical Exam  Constitutional: He is oriented to person, place, and time. He appears well-developed and well-nourished.  HENT:  Head: Normocephalic and atraumatic.  Eyes: Conjunctivae are normal.  Neck: Neck supple.  Cardiovascular: Normal rate and regular rhythm.   No murmur heard. Pulmonary/Chest: Effort normal. No respiratory distress.  Abdominal: Soft. He exhibits no distension and no mass. There is tenderness (minimal tenderness to deep palpation right lower quadrant). There is no rebound and no guarding. No hernia.  Musculoskeletal: He exhibits no edema.  Neurological: He is alert and oriented to person, place, and time.  Skin: Skin is warm and dry. No rash noted.  Psychiatric: He has a normal mood and affect. His behavior is normal. Judgment and thought content normal.  Nursing note and vitals reviewed.    UC Treatments / Results  Labs (all labs ordered are listed, but only abnormal results are displayed) Labs Reviewed  URINALYSIS, COMPLETE (UACMP) WITH MICROSCOPIC - Abnormal; Notable for the following:       Result Value   Squamous Epithelial / LPF 0-5 (*)    All other components within normal limits  CBC  BASIC METABOLIC PANEL    EKG  EKG Interpretation None       Radiology No results found.  Procedures Procedures (including critical care time)  Medications Ordered in UC Medications - No data to display   Initial Impression / Assessment and Plan / UC Course  I have reviewed the triage vital signs and the nursing notes.  Pertinent labs & imaging results that were available during my care of the patient were reviewed by me and considered in my medical decision making (see chart for details).   46 year old male with right lower quadrant abdominal pain. Vital signs within normal limits. No nausea vomiting or diarrhea. No constipation. Patient and wife is concerned about possible appendicitis, but decided to check basic labs.  Labs are within normal limits. Urinalysis checked, some concern for possible kidney stone but urine clean. Patient with history of IBS, recommend patient follow-up with gastroenterologist.  Final Clinical Impressions(s) / UC Diagnoses   Final diagnoses:  Right lower quadrant abdominal pain    New Prescriptions New Prescriptions   No medications on file        Duanne Guess, Hershal Coria 07/27/17 1601

## 2017-07-29 ENCOUNTER — Telehealth: Payer: Self-pay

## 2017-07-29 NOTE — Telephone Encounter (Signed)
Patient stated he has not seen in GI in years. Informed him he will need a appt with Dr Army Melia before getting new referral. He verbalized understanding and said he will call back in the AM to schedule appt.

## 2017-07-29 NOTE — Telephone Encounter (Signed)
Patient called leaving [facility] stating he was seen 2 days ago in UC due to lower right quadrant pain. Everything they did checked out to be fine. He states they told him to follow up with gastro or you. Wants advise on what to do- his pain has shifted in another place. He is concerned of possible kidney stone. Please Advise.

## 2017-07-29 NOTE — Telephone Encounter (Signed)
He had a clear urine and if pain has shifted to the other side then it is probably not a stone.  He can call his GI but if he has not been seen in years, he will need to see me first and then get a referral.

## 2017-08-07 ENCOUNTER — Other Ambulatory Visit: Payer: Self-pay | Admitting: Internal Medicine

## 2017-10-29 ENCOUNTER — Encounter: Payer: Self-pay | Admitting: Internal Medicine

## 2017-11-09 DIAGNOSIS — J209 Acute bronchitis, unspecified: Secondary | ICD-10-CM | POA: Diagnosis not present

## 2017-12-17 ENCOUNTER — Ambulatory Visit: Payer: Self-pay | Admitting: Internal Medicine

## 2018-01-03 ENCOUNTER — Ambulatory Visit: Payer: Self-pay | Admitting: Internal Medicine

## 2018-01-07 ENCOUNTER — Other Ambulatory Visit: Payer: Self-pay | Admitting: Internal Medicine

## 2018-01-10 ENCOUNTER — Ambulatory Visit: Payer: Self-pay | Admitting: Internal Medicine

## 2018-01-14 ENCOUNTER — Ambulatory Visit (INDEPENDENT_AMBULATORY_CARE_PROVIDER_SITE_OTHER): Payer: BLUE CROSS/BLUE SHIELD | Admitting: Internal Medicine

## 2018-01-14 ENCOUNTER — Encounter: Payer: Self-pay | Admitting: Internal Medicine

## 2018-01-14 ENCOUNTER — Other Ambulatory Visit: Payer: Self-pay | Admitting: Internal Medicine

## 2018-01-14 VITALS — BP 128/82 | HR 79 | Ht 68.0 in | Wt 185.0 lb

## 2018-01-14 DIAGNOSIS — I10 Essential (primary) hypertension: Secondary | ICD-10-CM | POA: Diagnosis not present

## 2018-01-14 DIAGNOSIS — K582 Mixed irritable bowel syndrome: Secondary | ICD-10-CM | POA: Diagnosis not present

## 2018-01-14 NOTE — Patient Instructions (Signed)
Fiber supplement Metamucil or Citrucel daily

## 2018-01-14 NOTE — Progress Notes (Signed)
Date:  01/14/2018   Name:  Jacob Blair   DOB:  [birth year]   MRN:  [id number]   Chief Complaint: Abdominal Pain (Lower right groin pain. Seen UC for this past fall. Went away at the time but now back. Off and on depending on what eating. Worse when eating greesy food, and red meat. Was having diarrhea all not this weekend after eating greasy foods. ) and Hypertension Hypertension  This is a chronic problem. The problem is controlled. Pertinent negatives include no chest pain. Past treatments include angiotensin blockers and diuretics. There are no compliance problems.   Abdominal Pain  This is a chronic problem. The onset quality is undetermined. The problem has been waxing and waning. The pain is located in the RLQ. The pain is mild. The quality of the pain is cramping. The abdominal pain does not radiate. Associated symptoms include constipation and diarrhea. Pertinent negatives include no arthralgias, belching, fever, frequency, hematochezia or hematuria. He has tried proton pump inhibitors (probiotics) for the symptoms. IBS      Review of Systems  Constitutional: Negative for chills, fatigue, fever and unexpected weight change.  Respiratory: Negative for chest tightness and stridor.   Cardiovascular: Negative for chest pain.  Gastrointestinal: Positive for abdominal pain, constipation and diarrhea. Negative for hematochezia.  Genitourinary: Negative for frequency, hematuria and testicular pain.  Musculoskeletal: Negative for arthralgias.  Allergic/Immunologic: Negative for environmental allergies.  Psychiatric/Behavioral: Negative for dysphoric mood. The patient is not nervous/anxious.     Patient Active Problem List   Diagnosis Date Noted  . Hyperlipidemia, mild 07/24/2016  . Low serum testosterone level 07/24/2016  . Decreased stamina 07/23/2016  . Allergic state 04/20/2015  . Essential (primary) hypertension 04/20/2015  . Gastro-esophageal reflux disease without esophagitis  04/20/2015  . IBS (irritable bowel syndrome) 04/20/2015    Prior to Admission medications   Medication Sig Start Date End Date Taking? Authorizing Provider  fluticasone (FLONASE) 50 MCG/ACT nasal spray Place 2 sprays into both nostrils daily. 12/22/15  Yes Glean Hess, MD  hydrochlorothiazide (HYDRODIURIL) 25 MG tablet TAKE 1 TABLET BY MOUTH EVERY DAY 01/07/18  Yes Glean Hess, MD  losartan (COZAAR) 50 MG tablet TAKE 1 TABLET(50 MG) BY MOUTH DAILY 08/07/17  Yes Glean Hess, MD  Omeprazole 20 MG TBEC Take 1 tablet by mouth daily.   Yes [provider]    Allergies  Allergen Reactions  . Ace Inhibitors Cough  . Peanut-Containing Drug Products   . Shellfish Allergy   . Amlodipine Rash    Past Surgical History:  Procedure Laterality Date  . UPPER GASTROINTESTINAL ENDOSCOPY  010209   small esophagus    Social History   Tobacco Use  . Smoking status: Never Smoker  . Smokeless tobacco: Current User    Types: Chew  Substance Use Topics  . Alcohol use: Yes    Alcohol/week: 0.0 oz  . Drug use: No     Medication list has been reviewed and updated.  PHQ 2/9 Scores 01/14/2018  PHQ - 2 Score 0    Physical Exam  Constitutional: He is oriented to person, place, and time. He appears well-developed. No distress.  HENT:  Head: Normocephalic and atraumatic.  Neck: Normal range of motion. Neck supple.  Cardiovascular: Normal rate, regular rhythm and normal heart sounds.  Pulmonary/Chest: Effort normal and breath sounds normal. No respiratory distress. He has no wheezes.  Abdominal: Soft. Normal appearance and bowel sounds are normal. There is tenderness (minimal to deep  palpation) in the right lower quadrant. There is no rigidity, no rebound, no guarding and no CVA tenderness. No hernia.  Musculoskeletal: Normal range of motion.  Neurological: He is alert and oriented to person, place, and time.  Skin: Skin is warm and dry. No rash noted.  Psychiatric: He has  a normal mood and affect. His behavior is normal. Thought content normal.  Nursing note and vitals reviewed.   BP 128/82   Pulse 79   Ht 5\' 8"  (1.727 m)   Wt 185 lb (83.9 kg)   SpO2 98%   BMI 28.13 kg/m   Assessment and Plan: 1. Essential (primary) hypertension controlled  2. Irritable bowel syndrome with both constipation and diarrhea Needs GI consult and probably colonoscopy Begin daily fiber supplement and continue probiotics - Ambulatory referral to Gastroenterology   No orders of the defined types were placed in this encounter.   Partially dictated using Editor, commissioning. Any errors are unintentional.  Halina Maidens, MD [facility]  01/14/2018

## 2018-01-17 ENCOUNTER — Ambulatory Visit: Payer: Self-pay | Admitting: Internal Medicine

## 2018-02-24 ENCOUNTER — Encounter: Payer: Self-pay | Admitting: Gastroenterology

## 2018-02-24 ENCOUNTER — Ambulatory Visit (INDEPENDENT_AMBULATORY_CARE_PROVIDER_SITE_OTHER): Payer: BLUE CROSS/BLUE SHIELD | Admitting: Gastroenterology

## 2018-02-24 VITALS — BP 159/83 | HR 86 | Ht 68.0 in | Wt 186.0 lb

## 2018-02-24 DIAGNOSIS — R1031 Right lower quadrant pain: Secondary | ICD-10-CM

## 2018-02-24 DIAGNOSIS — K582 Mixed irritable bowel syndrome: Secondary | ICD-10-CM | POA: Diagnosis not present

## 2018-02-24 DIAGNOSIS — G8929 Other chronic pain: Secondary | ICD-10-CM

## 2018-02-24 NOTE — Progress Notes (Signed)
Gastroenterology Consultation  Referring Provider:     Glean Hess, MD Primary Care Physician:  Glean Hess, MD Primary Gastroenterologist:  Dr. Allen Norris     Reason for Consultation:     Right lower quadrant pain        HPI:   Jacob Blair is a 47 y.o. y/o male referred for consultation & management of irritable bowel syndrome by Dr. Army Melia, Jesse Sans, MD.  This patient comes with a history of abdominal pain that has been waxing and waning. The pain is located in the RLQ. The pain is mild. The quality of the pain is cramping. The abdominal pain does not radiate.  The patient also reports that he has had constipation and diarrhea. The patient had been seeing Dr. Cletis Media in the past.  It appears that he had seen her as far back as 2007.  The patient was in urgent care was recommended to see his primary care provider or GI doctor.  His GI doctor has since retired.  The patient reports that his abdominal pain in the right side has been off and on for some time but then he was told by his primary care provider to start fiber in his diet.  The patient states that since he started the fiber he has not had any further abdominal pain.  He also states that his alternating diarrhea and constipation is gotten better.  Past Medical History:  Diagnosis Date  . Hypertension     Past Surgical History:  Procedure Laterality Date  . UPPER GASTROINTESTINAL ENDOSCOPY  010209   small esophagus    Prior to Admission medications   Medication Sig Start Date End Date Taking? Authorizing Provider  fluticasone (FLONASE) 50 MCG/ACT nasal spray Place 2 sprays into both nostrils daily. 12/22/15   Glean Hess, MD  hydrochlorothiazide (HYDRODIURIL) 25 MG tablet TAKE 1 TABLET BY MOUTH EVERY DAY 01/07/18   Glean Hess, MD  losartan (COZAAR) 50 MG tablet TAKE 1 TABLET(50 MG) BY MOUTH DAILY 08/07/17   Glean Hess, MD  Omeprazole 20 MG TBEC Take 1 tablet by mouth daily.    [provider]  Probiotic Product (PROBIOTIC FORMULA PO) Take 1 packet by mouth daily.    [provider]    Family History  Adopted: Yes  Family history unknown: Yes     Social History   Tobacco Use  . Smoking status: Never Smoker  . Smokeless tobacco: Current User    Types: Chew  Substance Use Topics  . Alcohol use: Yes    Alcohol/week: 0.0 oz  . Drug use: No    Allergies as of 02/24/2018 - Review Complete 01/14/2018  Allergen Reaction Noted  . Ace inhibitors Cough 04/20/2015  . Peanut-containing drug products  04/20/2015  . Shellfish allergy  04/20/2015  . Amlodipine Rash 04/20/2015    Review of Systems:    All systems reviewed and negative except where noted in HPI.   Physical Exam:  There were no vitals taken for this visit. No LMP for male patient. Psych:  Alert and cooperative. Normal mood and affect. General:   Alert,  Well-developed, well-nourished, pleasant and cooperative in NAD Head:  Normocephalic and atraumatic. Eyes:  Sclera clear, no icterus.   Conjunctiva pink. Ears:  Normal auditory acuity. Nose:  No deformity, discharge, or lesions. Mouth:  No deformity or lesions,oropharynx pink & moist. Neck:  Supple; no masses or thyromegaly. Lungs:  Respirations even and unlabored.  Clear throughout  to auscultation.   No wheezes, crackles, or rhonchi. No acute distress. Heart:  Regular rate and rhythm; no murmurs, clicks, rubs, or gallops. Abdomen:  Normal bowel sounds.  No bruits.  Soft, non-tender and non-distended without masses, hepatosplenomegaly or hernias noted.  No guarding or rebound tenderness.  Negative Carnett sign.   Rectal:  Deferred.  Msk:  Symmetrical without gross deformities.  Good, equal movement & strength bilaterally. Pulses:  Normal pulses noted. Extremities:  No clubbing or edema.  No cyanosis. Neurologic:  Alert and oriented x3;  grossly normal neurologically. Skin:  Intact without significant lesions or rashes.  No jaundice. Lymph Nodes:   No significant cervical adenopathy. Psych:  Alert and cooperative. Normal mood and affect.  Imaging Studies: No results found.  Assessment and Plan:   Jacob Blair is a 47 y.o. y/o male has had chronic intermittent lower right abdominal pain.  The patient has had these symptoms associated with both diarrhea and constipation.  The patient will be set up for colonoscopy to rule out any pathology in the colon such as colitis or neoplasms as the cause of his symptoms.  A lot of the characteristics of his symptoms are likely from irritable bowel syndrome.  The patient has been doing much better on his high-fiber diet and states that his right lower quadrant pain has completely resolved.  The patient states that he would like to hold off on any colonoscopy at this time since he does not have any worse symptoms such as weight loss rectal bleeding or a change from his usual alternating diarrhea and constipation.  The patient has been explained that he should contact me if any of his symptoms change.  The patient has been explained the plan and agrees with it.  Lucilla Lame, MD. [facility]   Note: This dictation was prepared with Dragon dictation along with smaller phrase technology. Any transcriptional errors that result from this process are unintentional.

## 2018-05-13 ENCOUNTER — Encounter: Payer: Self-pay | Admitting: Internal Medicine

## 2018-07-14 ENCOUNTER — Other Ambulatory Visit: Payer: Self-pay | Admitting: Internal Medicine

## 2018-09-15 ENCOUNTER — Encounter: Payer: Self-pay | Admitting: Internal Medicine

## 2018-09-28 ENCOUNTER — Other Ambulatory Visit: Payer: Self-pay | Admitting: Internal Medicine

## 2018-10-20 ENCOUNTER — Encounter: Payer: Self-pay | Admitting: Internal Medicine

## 2018-10-20 ENCOUNTER — Ambulatory Visit (INDEPENDENT_AMBULATORY_CARE_PROVIDER_SITE_OTHER): Payer: BLUE CROSS/BLUE SHIELD | Admitting: Internal Medicine

## 2018-10-20 VITALS — BP 140/90 | HR 82 | Resp 16 | Ht 68.0 in | Wt 188.0 lb

## 2018-10-20 DIAGNOSIS — E785 Hyperlipidemia, unspecified: Secondary | ICD-10-CM | POA: Diagnosis not present

## 2018-10-20 DIAGNOSIS — Z0000 Encounter for general adult medical examination without abnormal findings: Secondary | ICD-10-CM

## 2018-10-20 DIAGNOSIS — K219 Gastro-esophageal reflux disease without esophagitis: Secondary | ICD-10-CM

## 2018-10-20 DIAGNOSIS — K582 Mixed irritable bowel syndrome: Secondary | ICD-10-CM | POA: Diagnosis not present

## 2018-10-20 DIAGNOSIS — H6122 Impacted cerumen, left ear: Secondary | ICD-10-CM

## 2018-10-20 DIAGNOSIS — I10 Essential (primary) hypertension: Secondary | ICD-10-CM

## 2018-10-20 DIAGNOSIS — Z125 Encounter for screening for malignant neoplasm of prostate: Secondary | ICD-10-CM | POA: Diagnosis not present

## 2018-10-20 LAB — POCT URINALYSIS DIPSTICK
Bilirubin, UA: NEGATIVE
Blood, UA: NEGATIVE
Glucose, UA: NEGATIVE
Ketones, UA: NEGATIVE
Leukocytes, UA: NEGATIVE
Nitrite, UA: NEGATIVE
Protein, UA: NEGATIVE
Spec Grav, UA: 1.015 (ref 1.010–1.025)
Urobilinogen, UA: 0.2 E.U./dL
pH, UA: 6.5 (ref 5.0–8.0)

## 2018-10-20 MED ORDER — LOSARTAN POTASSIUM 100 MG PO TABS: 100.0000 mg | ORAL_TABLET | Freq: Every day | ORAL | 5 refills | Status: DC

## 2018-10-20 NOTE — Progress Notes (Signed)
Date:  10/20/2018   Name:  Jacob Blair   DOB:  [birth year]   MRN:  [id number]   Chief Complaint: Annual Exam Jacob Blair is a 47 y.o. male who presents today for his Complete Annual Exam. He feels fairly well. He reports exercising regularly. He reports he is sleeping well.   Hypertension  Pertinent negatives include no chest pain, headaches, palpitations or shortness of breath. Past treatments include angiotensin blockers and diuretics. The current treatment provides significant improvement.  Gastroesophageal Reflux  He complains of heartburn. He reports no abdominal pain, no chest pain, no choking or no wheezing. The problem occurs rarely. Pertinent negatives include no fatigue. He has tried a histamine-2 antagonist for the symptoms.  IBS - seen by GI, recommended high fiber diet and colonoscopy.  Pt elected to hold off on colonoscopy and has been doing well on fiber and probiotics.  Review of Systems  Constitutional: Negative for appetite change, chills, diaphoresis, fatigue and unexpected weight change.  HENT: Negative for hearing loss, tinnitus, trouble swallowing and voice change.        Ear fullness on left  Eyes: Negative for visual disturbance.  Respiratory: Negative for choking, shortness of breath and wheezing.   Cardiovascular: Negative for chest pain, palpitations and leg swelling.  Gastrointestinal: Positive for heartburn. Negative for abdominal pain, blood in stool, constipation and diarrhea.  Genitourinary: Negative for decreased urine volume, difficulty urinating, dysuria, frequency, scrotal swelling and testicular pain.  Musculoskeletal: Negative for arthralgias, back pain and myalgias.  Skin: Negative for color change and rash.       Dark loose right great toenail   Allergic/Immunologic: Negative for environmental allergies.  Neurological: Negative for dizziness, syncope and headaches.  Hematological: Negative for adenopathy.  Psychiatric/Behavioral: Negative  for dysphoric mood and sleep disturbance.    Patient Active Problem List   Diagnosis Date Noted  . Hyperlipidemia, mild 07/24/2016  . Low serum testosterone level 07/24/2016  . Decreased stamina 07/23/2016  . Allergic state 04/20/2015  . Essential (primary) hypertension 04/20/2015  . Gastro-esophageal reflux disease without esophagitis 04/20/2015  . IBS (irritable bowel syndrome) 04/20/2015    Allergies  Allergen Reactions  . Ace Inhibitors Cough  . Peanut-Containing Drug Products   . Shellfish Allergy   . Amlodipine Rash    Past Surgical History:  Procedure Laterality Date  . UPPER GASTROINTESTINAL ENDOSCOPY  010209   small esophagus    Social History   Tobacco Use  . Smoking status: Never Smoker  . Smokeless tobacco: Former Systems developer    Types: Chew  Substance Use Topics  . Alcohol use: Yes    Comment: occasional  . Drug use: No     Medication list has been reviewed and updated.  Current Meds  Medication Sig  . Calcium Polycarbophil (FIBER-CAPS PO) Take by mouth.  . fluticasone (FLONASE) 50 MCG/ACT nasal spray Place 2 sprays into both nostrils daily.  . hydrochlorothiazide (HYDRODIURIL) 25 MG tablet TAKE 1 TABLET BY MOUTH EVERY DAY  . losartan (COZAAR) 50 MG tablet TAKE 1 TABLET(50 MG) BY MOUTH DAILY  . Omeprazole 20 MG TBEC Take 1 tablet by mouth daily.  . Probiotic Product (PROBIOTIC FORMULA PO) Take 1 packet by mouth daily.    PHQ 2/9 Scores 10/20/2018 01/14/2018  PHQ - 2 Score 0 0    Physical Exam  Constitutional: He is oriented to person, place, and time. He appears well-developed and well-nourished.  HENT:  Head: Normocephalic.  Right Ear: Tympanic membrane, external ear  and ear canal normal.  Left Ear: Tympanic membrane, external ear and ear canal normal.  Nose: Nose normal.  Mouth/Throat: Uvula is midline and oropharynx is clear and moist.  Left canal and drum normal after large amoutn of impacted cerumen removed with curette.  Pt tolerated procedure  well with subjective improvement in hearing.   Eyes: Pupils are equal, round, and reactive to light. Conjunctivae and EOM are normal.  Neck: Normal range of motion. Neck supple. Carotid bruit is not present. No thyromegaly present.  Cardiovascular: Normal rate, regular rhythm, normal heart sounds and intact distal pulses.  Pulmonary/Chest: Effort normal and breath sounds normal. He has no wheezes. Right breast exhibits no mass. Left breast exhibits no mass.  Abdominal: Soft. Normal appearance and bowel sounds are normal. There is no hepatosplenomegaly. There is no tenderness.  Musculoskeletal: Normal range of motion.  Lymphadenopathy:    He has no cervical adenopathy.  Neurological: He is alert and oriented to person, place, and time. He has normal reflexes.  Skin: Skin is warm, dry and intact.  Psychiatric: He has a normal mood and affect. His speech is normal and behavior is normal. Judgment and thought content normal.  Nursing note and vitals reviewed.   BP 140/90   Pulse 82   Resp 16   Ht 5\' 8"  (1.727 m)   Wt 188 lb (85.3 kg)   BMI 28.59 kg/m   Assessment and Plan: 1. Annual physical exam Continue healthy diet and regular exercise - POCT urinalysis dipstick  2. Prostate cancer screening DRE deferred - PSA  3. Essential (primary) hypertension Not optimal - will increase losartan - CBC with Differential/Platelet - Comprehensive metabolic panel - losartan (COZAAR) 100 MG tablet; Take 1 tablet (100 mg total) by mouth daily.  Dispense: 30 tablet; Refill: 5  4. Gastro-esophageal reflux disease without esophagitis Controlled on PPI  5. Hyperlipidemia, mild Check labs Continue healthy diet - Lipid panel  6. Irritable bowel syndrome with both constipation and diarrhea Stable  7. Impacted cerumen of left ear Removed via curette with resolution of diminished hearing   Partially dictated using Editor, commissioning. Any errors are unintentional.  Halina Maidens, MD [facility]  10/20/2018

## 2018-10-21 LAB — CBC WITH DIFFERENTIAL/PLATELET
Basophils Absolute: 0.1 10*3/uL (ref 0.0–0.2)
Basos: 1 %
EOS (ABSOLUTE): 0.2 10*3/uL (ref 0.0–0.4)
Eos: 4 %
Hematocrit: 42.9 % (ref 37.5–51.0)
Hemoglobin: 14.7 g/dL (ref 13.0–17.7)
Immature Grans (Abs): 0 10*3/uL (ref 0.0–0.1)
Immature Granulocytes: 0 %
Lymphocytes Absolute: 1.9 10*3/uL (ref 0.7–3.1)
Lymphs: 27 %
MCH: 31.4 pg (ref 26.6–33.0)
MCHC: 34.3 g/dL (ref 31.5–35.7)
MCV: 92 fL (ref 79–97)
Monocytes Absolute: 0.7 10*3/uL (ref 0.1–0.9)
Monocytes: 10 %
Neutrophils Absolute: 4 10*3/uL (ref 1.4–7.0)
Neutrophils: 58 %
Platelets: 348 10*3/uL (ref 150–450)
RBC: 4.68 x10E6/uL (ref 4.14–5.80)
RDW: 12.7 % (ref 12.3–15.4)
WBC: 6.9 10*3/uL (ref 3.4–10.8)

## 2018-10-21 LAB — LIPID PANEL
Chol/HDL Ratio: 4.3 ratio (ref 0.0–5.0)
Cholesterol, Total: 198 mg/dL (ref 100–199)
HDL: 46 mg/dL (ref >39)
LDL Calculated: 125 mg/dL — ABNORMAL HIGH (ref 0–99)
Triglycerides: 134 mg/dL (ref 0–149)
VLDL Cholesterol Cal: 27 mg/dL (ref 5–40)

## 2018-10-21 LAB — COMPREHENSIVE METABOLIC PANEL
ALT: 39 IU/L (ref 0–44)
AST: 26 IU/L (ref 0–40)
Albumin/Globulin Ratio: 1.8 (ref 1.2–2.2)
Albumin: 4.6 g/dL (ref 3.5–5.5)
Alkaline Phosphatase: 83 IU/L (ref 39–117)
BUN/Creatinine Ratio: 10 (ref 9–20)
BUN: 11 mg/dL (ref 6–24)
Bilirubin Total: 0.6 mg/dL (ref 0.0–1.2)
CO2: 27 mmol/L (ref 20–29)
Calcium: 9.6 mg/dL (ref 8.7–10.2)
Chloride: 102 mmol/L (ref 96–106)
Creatinine, Ser: 1.06 mg/dL (ref 0.76–1.27)
GFR calc Af Amer: 95 mL/min/{1.73_m2} (ref >59)
GFR calc non Af Amer: 83 mL/min/{1.73_m2} (ref >59)
Globulin, Total: 2.5 g/dL (ref 1.5–4.5)
Glucose: 95 mg/dL (ref 65–99)
Potassium: 4.3 mmol/L (ref 3.5–5.2)
Sodium: 143 mmol/L (ref 134–144)
Total Protein: 7.1 g/dL (ref 6.0–8.5)

## 2018-10-21 LAB — PSA: Prostate Specific Ag, Serum: 1 ng/mL (ref 0.0–4.0)

## 2018-10-28 ENCOUNTER — Other Ambulatory Visit: Payer: Self-pay | Admitting: Internal Medicine

## 2018-12-26 ENCOUNTER — Ambulatory Visit: Payer: Self-pay | Admitting: Internal Medicine

## 2019-01-02 ENCOUNTER — Encounter: Payer: Self-pay | Admitting: Internal Medicine

## 2019-01-02 ENCOUNTER — Ambulatory Visit (INDEPENDENT_AMBULATORY_CARE_PROVIDER_SITE_OTHER): Payer: BLUE CROSS/BLUE SHIELD | Admitting: Internal Medicine

## 2019-01-02 VITALS — BP 116/86 | HR 85 | Ht 68.0 in | Wt 189.0 lb

## 2019-01-02 DIAGNOSIS — I10 Essential (primary) hypertension: Secondary | ICD-10-CM | POA: Diagnosis not present

## 2019-01-02 NOTE — Progress Notes (Signed)
Date:  01/02/2019   Name:  Jacob Blair   DOB:  [birth year]   MRN:  [id number]   Chief Complaint: Hypertension  Hypertension  This is a chronic problem. The problem has been gradually improving since onset. The problem is controlled. Pertinent negatives include no chest pain, headaches, palpitations or shortness of breath. Past treatments include angiotensin blockers (losartan increased last visit). There are no compliance problems.    Lab Results  Component Value Date   CREATININE 1.06 10/20/2018   BUN 11 10/20/2018   NA 143 10/20/2018   K 4.3 10/20/2018   CL 102 10/20/2018   CO2 27 10/20/2018     Review of Systems  Constitutional: Negative for fatigue and unexpected weight change.  Eyes: Negative for visual disturbance.  Respiratory: Negative for cough, shortness of breath and wheezing.   Cardiovascular: Negative for chest pain, palpitations and leg swelling.  Gastrointestinal: Negative for abdominal pain and blood in stool.  Genitourinary: Negative for dysuria and hematuria.  Neurological: Negative for tremors, numbness and headaches.  Psychiatric/Behavioral: Negative for dysphoric mood.    Patient Active Problem List   Diagnosis Date Noted  . Hyperlipidemia, mild 07/24/2016  . Low serum testosterone level 07/24/2016  . Decreased stamina 07/23/2016  . Allergic state 04/20/2015  . Essential (primary) hypertension 04/20/2015  . Gastro-esophageal reflux disease without esophagitis 04/20/2015  . IBS (irritable bowel syndrome) 04/20/2015    Allergies  Allergen Reactions  . Ace Inhibitors Cough  . Peanut-Containing Drug Products   . Shellfish Allergy   . Amlodipine Rash    Past Surgical History:  Procedure Laterality Date  . COLONOSCOPY  12/12/2007  . UPPER GASTROINTESTINAL ENDOSCOPY  010209   small esophagus    Social History   Tobacco Use  . Smoking status: Never Smoker  . Smokeless tobacco: Former Systems developer    Types: Chew  Substance Use Topics  .  Alcohol use: Yes    Comment: occasional  . Drug use: No     Medication list has been reviewed and updated.  Current Meds  Medication Sig  . Calcium Polycarbophil (FIBER-CAPS PO) Take by mouth.  . fluticasone (FLONASE) 50 MCG/ACT nasal spray Place 2 sprays into both nostrils daily.  . hydrochlorothiazide (HYDRODIURIL) 25 MG tablet TAKE 1 TABLET BY MOUTH EVERY DAY  . losartan (COZAAR) 100 MG tablet Take 1 tablet (100 mg total) by mouth daily.  . Omeprazole 20 MG TBEC Take 1 tablet by mouth daily.  . Probiotic Product (PROBIOTIC FORMULA PO) Take 1 packet by mouth daily.    PHQ 2/9 Scores 10/20/2018 01/14/2018  PHQ - 2 Score 0 0   Wt Readings from Last 3 Encounters:  01/02/19 189 lb (85.7 kg)  10/20/18 188 lb (85.3 kg)  02/24/18 186 lb (84.4 kg)    Physical Exam Vitals signs and nursing note reviewed.  Constitutional:      General: He is not in acute distress.    Appearance: He is well-developed.  HENT:     Head: Normocephalic and atraumatic.  Neck:     Musculoskeletal: Normal range of motion and neck supple.  Cardiovascular:     Rate and Rhythm: Normal rate and regular rhythm.     Pulses: Normal pulses.  Pulmonary:     Effort: Pulmonary effort is normal. No respiratory distress.     Breath sounds: Normal breath sounds.  Musculoskeletal: Normal range of motion.     Right lower leg: No edema.     Left lower leg: No  edema.  Skin:    General: Skin is warm and dry.     Findings: No rash.  Neurological:     Mental Status: He is alert and oriented to person, place, and time.  Psychiatric:        Behavior: Behavior normal.        Thought Content: Thought content normal.     BP 116/86   Pulse (!) 103   Ht 5\' 8"  (1.727 m)   Wt 189 lb (85.7 kg)   SpO2 100%   BMI 28.74 kg/m   Assessment and Plan: 1. Essential (primary) hypertension Continue same regimen CPX in November   Partially dictated using Editor, commissioning. Any errors are unintentional.  Halina Maidens,  MD [facility]  01/02/2019

## 2019-02-05 ENCOUNTER — Other Ambulatory Visit: Payer: Self-pay | Admitting: Internal Medicine

## 2019-04-19 ENCOUNTER — Other Ambulatory Visit: Payer: Self-pay | Admitting: Internal Medicine

## 2019-04-19 DIAGNOSIS — I10 Essential (primary) hypertension: Secondary | ICD-10-CM

## 2019-10-23 ENCOUNTER — Encounter: Payer: Self-pay | Admitting: Internal Medicine

## 2019-11-04 ENCOUNTER — Encounter: Payer: BLUE CROSS/BLUE SHIELD | Admitting: Internal Medicine

## 2019-12-08 ENCOUNTER — Encounter: Payer: BLUE CROSS/BLUE SHIELD | Admitting: Internal Medicine

## 2020-01-22 ENCOUNTER — Ambulatory Visit (INDEPENDENT_AMBULATORY_CARE_PROVIDER_SITE_OTHER): Payer: BC Managed Care – PPO | Admitting: Internal Medicine

## 2020-01-22 ENCOUNTER — Encounter: Payer: Self-pay | Admitting: Internal Medicine

## 2020-01-22 ENCOUNTER — Other Ambulatory Visit: Payer: Self-pay

## 2020-01-22 VITALS — BP 122/78 | HR 99 | Temp 98.2°F | Ht 68.0 in | Wt 187.6 lb

## 2020-01-22 DIAGNOSIS — I10 Essential (primary) hypertension: Secondary | ICD-10-CM | POA: Diagnosis not present

## 2020-01-22 DIAGNOSIS — Z0000 Encounter for general adult medical examination without abnormal findings: Secondary | ICD-10-CM

## 2020-01-22 DIAGNOSIS — Z1211 Encounter for screening for malignant neoplasm of colon: Secondary | ICD-10-CM

## 2020-01-22 DIAGNOSIS — Z125 Encounter for screening for malignant neoplasm of prostate: Secondary | ICD-10-CM

## 2020-01-22 DIAGNOSIS — K219 Gastro-esophageal reflux disease without esophagitis: Secondary | ICD-10-CM

## 2020-01-22 LAB — POCT URINALYSIS DIPSTICK
Bilirubin, UA: NEGATIVE
Blood, UA: NEGATIVE
Glucose, UA: NEGATIVE
Ketones, UA: NEGATIVE
Leukocytes, UA: NEGATIVE
Nitrite, UA: NEGATIVE
Protein, UA: POSITIVE — AB
Spec Grav, UA: 1.015 (ref 1.010–1.025)
Urobilinogen, UA: 0.2 E.U./dL
pH, UA: 7.5 (ref 5.0–8.0)

## 2020-01-22 MED ORDER — HYDROCHLOROTHIAZIDE 25 MG PO TABS: 25.0000 mg | ORAL_TABLET | Freq: Every day | ORAL | 12 refills | Status: DC

## 2020-01-22 NOTE — Progress Notes (Signed)
Date:  01/22/2020   Name:  Jacob Blair   DOB:  Jul 06, 1971   MRN:  [id number]   Chief Complaint: Annual Exam Jacob Blair is a 49 y.o. male who presents today for his Complete Annual Exam. He feels well. He reports exercising regularly. He reports he is sleeping well.   Colonoscopy 2009 Immunization History  Administered Date(s) Administered  . Influenza Inj Mdck Quad Pf 01/16/2017, 11/28/2017  . Influenza,inj,Quad PF,6+ Mos 10/15/2018  . Influenza-Unspecified 09/10/2015, 11/28/2017    Hypertension This is a chronic problem. The problem is controlled. Pertinent negatives include no chest pain, headaches, palpitations or shortness of breath. Past treatments include angiotensin blockers and diuretics. There are no compliance problems.   Gastroesophageal Reflux He reports no abdominal pain, no chest pain, no choking, no heartburn or no wheezing. This is a recurrent problem. Pertinent negatives include no fatigue. He has tried a PPI for the symptoms.  Shoulder Pain  The pain is present in the right shoulder. This is a chronic problem. The problem has been gradually worsening. The quality of the pain is described as aching. The pain is mild.    Lab Results  Component Value Date   CREATININE 1.06 10/20/2018   BUN 11 10/20/2018   NA 143 10/20/2018   K 4.3 10/20/2018   CL 102 10/20/2018   CO2 27 10/20/2018   Lab Results  Component Value Date   CHOL 198 10/20/2018   HDL 46 10/20/2018   LDLCALC 125 (H) 10/20/2018   TRIG 134 10/20/2018   CHOLHDL 4.3 10/20/2018   Lab Results  Component Value Date   TSH 1.400 07/23/2016   No results found for: HGBA1C   Review of Systems  Constitutional: Negative for appetite change, chills, diaphoresis, fatigue and unexpected weight change.  HENT: Negative for hearing loss, tinnitus, trouble swallowing and voice change.   Eyes: Negative for visual disturbance.  Respiratory: Negative for choking, shortness of breath and wheezing.     Cardiovascular: Negative for chest pain, palpitations and leg swelling.  Gastrointestinal: Negative for abdominal pain, blood in stool, constipation, diarrhea and heartburn.  Genitourinary: Negative for difficulty urinating, dysuria and frequency.  Musculoskeletal: Positive for arthralgias (right shoulder). Negative for back pain and myalgias.  Skin: Negative for color change and rash.  Allergic/Immunologic: Positive for environmental allergies.  Neurological: Negative for dizziness, syncope and headaches.  Hematological: Negative for adenopathy.  Psychiatric/Behavioral: Negative for dysphoric mood and sleep disturbance.    Patient Active Problem List   Diagnosis Date Noted  . Hyperlipidemia, mild 07/24/2016  . Low serum testosterone level 07/24/2016  . Allergic state 04/20/2015  . Essential (primary) hypertension 04/20/2015  . Gastro-esophageal reflux disease without esophagitis 04/20/2015  . IBS (irritable bowel syndrome) 04/20/2015    Allergies  Allergen Reactions  . Ace Inhibitors Cough  . Peanut-Containing Drug Products   . Shellfish Allergy   . Amlodipine Rash    Past Surgical History:  Procedure Laterality Date  . COLONOSCOPY  12/12/2007  . UPPER GASTROINTESTINAL ENDOSCOPY  010209   small esophagus    Social History   Tobacco Use  . Smoking status: Never Smoker  . Smokeless tobacco: Former Systems developer    Types: Chew  Substance Use Topics  . Alcohol use: Yes    Comment: occasional  . Drug use: No     Medication list has been reviewed and updated.  Current Meds  Medication Sig  . Calcium Polycarbophil (FIBER-CAPS PO) Take by mouth.  . fluticasone (FLONASE) 50  MCG/ACT nasal spray Place 2 sprays into both nostrils daily.  . hydrochlorothiazide (HYDRODIURIL) 25 MG tablet TAKE 1 TABLET BY MOUTH EVERY DAY  . losartan (COZAAR) 100 MG tablet TAKE 1 TABLET(100 MG) BY MOUTH DAILY  . Omeprazole 20 MG TBEC Take 1 tablet by mouth daily.  . Probiotic Product (PROBIOTIC  FORMULA PO) Take 1 packet by mouth daily.    PHQ 2/9 Scores 01/22/2020 10/20/2018 01/14/2018  PHQ - 2 Score 0 0 0  PHQ- 9 Score 0 - -    BP Readings from Last 3 Encounters:  01/22/20 122/78  01/02/19 116/86  10/20/18 140/90    Physical Exam Vitals and nursing note reviewed.  Constitutional:      Appearance: Normal appearance. He is well-developed.  HENT:     Head: Normocephalic.     Right Ear: Tympanic membrane, ear canal and external ear normal.     Left Ear: Tympanic membrane, ear canal and external ear normal.     Nose: Nose normal.     Mouth/Throat:     Pharynx: Uvula midline.  Eyes:     Conjunctiva/sclera: Conjunctivae normal.     Pupils: Pupils are equal, round, and reactive to light.  Neck:     Thyroid: No thyromegaly.     Vascular: No carotid bruit.  Cardiovascular:     Rate and Rhythm: Normal rate and regular rhythm.     Heart sounds: Normal heart sounds.  Pulmonary:     Effort: Pulmonary effort is normal.     Breath sounds: Normal breath sounds. No wheezing.  Chest:     Breasts:        Right: No mass.        Left: No mass.  Abdominal:     General: Bowel sounds are normal.     Palpations: Abdomen is soft.     Tenderness: There is no abdominal tenderness.  Musculoskeletal:     Cervical back: Normal range of motion and neck supple.     Right lower leg: No edema.     Left lower leg: No edema.  Lymphadenopathy:     Cervical: No cervical adenopathy.  Skin:    General: Skin is warm and dry.     Capillary Refill: Capillary refill takes less than 2 seconds.  Neurological:     General: No focal deficit present.     Mental Status: He is alert and oriented to person, place, and time.     Deep Tendon Reflexes: Reflexes are normal and symmetric.  Psychiatric:        Speech: Speech normal.        Behavior: Behavior normal.        Thought Content: Thought content normal.        Judgment: Judgment normal.     Wt Readings from Last 3 Encounters:  01/22/20 187  lb 9.6 oz (85.1 kg)  01/02/19 189 lb (85.7 kg)  10/20/18 188 lb (85.3 kg)    BP 122/78   Pulse 99   Temp 98.2 F (36.8 C) (Oral)   Ht 5\' 8"  (1.727 m)   Wt 187 lb 9.6 oz (85.1 kg)   SpO2 99%   BMI 28.52 kg/m   Assessment and Plan: 1. Annual physical exam Normal exam Continue healthy diet, exericse - Lipid panel - POCT urinalysis dipstick  2. Colon cancer screening - Ambulatory referral to Gastroenterology  3. Essential (primary) hypertension Clinically stable exam with well controlled BP on losartan and hctz. Tolerating medications without side effects  at this time. Pt to continue current regimen and low sodium diet; benefits of regular exercise as able discussed. - Comprehensive metabolic panel - hydrochlorothiazide (HYDRODIURIL) 25 MG tablet; Take 1 tablet (25 mg total) by mouth daily.  Dispense: 30 tablet; Refill: 12  4. Gastro-esophageal reflux disease without esophagitis Symptoms well controlled on daily PPI No red flag signs such as weight loss, n/v, melena Will continue omeprazole 20 mg. - CBC with Differential/Platelet  5. Prostate cancer screening DRE deferred to lack of symptoms - PSA   Partially dictated using Dragon software. Any errors are unintentional.  Halina Maidens, MD [facility]  01/22/2020

## 2020-01-23 LAB — CBC WITH DIFFERENTIAL/PLATELET
Basophils Absolute: 0.1 10*3/uL (ref 0.0–0.2)
Basos: 1 %
EOS (ABSOLUTE): 0.3 10*3/uL (ref 0.0–0.4)
Eos: 6 %
Hematocrit: 41.7 % (ref 37.5–51.0)
Hemoglobin: 14.5 g/dL (ref 13.0–17.7)
Immature Grans (Abs): 0 10*3/uL (ref 0.0–0.1)
Immature Granulocytes: 0 %
Lymphocytes Absolute: 1.6 10*3/uL (ref 0.7–3.1)
Lymphs: 26 %
MCH: 31.6 pg (ref 26.6–33.0)
MCHC: 34.8 g/dL (ref 31.5–35.7)
MCV: 91 fL (ref 79–97)
Monocytes Absolute: 0.5 10*3/uL (ref 0.1–0.9)
Monocytes: 9 %
Neutrophils Absolute: 3.5 10*3/uL (ref 1.4–7.0)
Neutrophils: 58 %
Platelets: 336 10*3/uL (ref 150–450)
RBC: 4.59 x10E6/uL (ref 4.14–5.80)
RDW: 12.2 % (ref 11.6–15.4)
WBC: 6 10*3/uL (ref 3.4–10.8)

## 2020-01-23 LAB — COMPREHENSIVE METABOLIC PANEL
ALT: 37 IU/L (ref 0–44)
AST: 23 IU/L (ref 0–40)
Albumin/Globulin Ratio: 1.7 (ref 1.2–2.2)
Albumin: 4.6 g/dL (ref 4.0–5.0)
Alkaline Phosphatase: 92 IU/L (ref 39–117)
BUN/Creatinine Ratio: 14 (ref 9–20)
BUN: 14 mg/dL (ref 6–24)
Bilirubin Total: 0.3 mg/dL (ref 0.0–1.2)
CO2: 24 mmol/L (ref 20–29)
Calcium: 9.9 mg/dL (ref 8.7–10.2)
Chloride: 101 mmol/L (ref 96–106)
Creatinine, Ser: 1.03 mg/dL (ref 0.76–1.27)
GFR calc Af Amer: 97 mL/min/{1.73_m2} (ref >59)
GFR calc non Af Amer: 84 mL/min/{1.73_m2} (ref >59)
Globulin, Total: 2.7 g/dL (ref 1.5–4.5)
Glucose: 102 mg/dL — ABNORMAL HIGH (ref 65–99)
Potassium: 4.4 mmol/L (ref 3.5–5.2)
Sodium: 141 mmol/L (ref 134–144)
Total Protein: 7.3 g/dL (ref 6.0–8.5)

## 2020-01-23 LAB — LIPID PANEL
Chol/HDL Ratio: 4.2 ratio (ref 0.0–5.0)
Cholesterol, Total: 205 mg/dL — ABNORMAL HIGH (ref 100–199)
HDL: 49 mg/dL (ref >39)
LDL Chol Calc (NIH): 139 mg/dL — ABNORMAL HIGH (ref 0–99)
Triglycerides: 93 mg/dL (ref 0–149)
VLDL Cholesterol Cal: 17 mg/dL (ref 5–40)

## 2020-01-23 LAB — PSA: Prostate Specific Ag, Serum: 1.2 ng/mL (ref 0.0–4.0)

## 2020-01-27 ENCOUNTER — Other Ambulatory Visit: Payer: Self-pay

## 2020-01-27 ENCOUNTER — Telehealth: Payer: Self-pay

## 2020-01-27 DIAGNOSIS — Z1211 Encounter for screening for malignant neoplasm of colon: Secondary | ICD-10-CM

## 2020-01-27 NOTE — Telephone Encounter (Signed)
Gastroenterology Pre-Procedure Review  Request Date: Monday 04/04/20 Requesting Physician: Dr. Allen Norris  PATIENT REVIEW QUESTIONS: The patient responded to the following health history questions as indicated:    1. Are you having any GI issues? no 2. Do you have a personal history of Polyps? no 3. Do you have a family history of Colon Cancer or Polyps? no 4. Diabetes Mellitus? no 5. Joint replacements in the past 12 months?no 6. Major health problems in the past 3 months?no 7. Any artificial heart valves, MVP, or defibrillator?no    MEDICATIONS & ALLERGIES:    Patient reports the following regarding taking any anticoagulation/antiplatelet therapy:   Plavix, Coumadin, Eliquis, Xarelto, Lovenox, Pradaxa, Brilinta, or Effient? no Aspirin? no  Patient confirms/reports the following medications:  Current Outpatient Medications  Medication Sig Dispense Refill  . Calcium Polycarbophil (FIBER-CAPS PO) Take by mouth.    . fluticasone (FLONASE) 50 MCG/ACT nasal spray Place 2 sprays into both nostrils daily. 16 g 12  . hydrochlorothiazide (HYDRODIURIL) 25 MG tablet Take 1 tablet (25 mg total) by mouth daily. 30 tablet 12  . losartan (COZAAR) 100 MG tablet TAKE 1 TABLET(100 MG) BY MOUTH DAILY 30 tablet 12  . Omeprazole 20 MG TBEC Take 1 tablet by mouth daily.    . Probiotic Product (PROBIOTIC FORMULA PO) Take 1 packet by mouth daily.     No current facility-administered medications for this visit.    Patient confirms/reports the following allergies:  Allergies  Allergen Reactions  . Ace Inhibitors Cough  . Peanut-Containing Drug Products   . Shellfish Allergy   . Amlodipine Rash    No orders of the defined types were placed in this encounter.   AUTHORIZATION INFORMATION Primary Insurance: 1D#: Group #:  Secondary Insurance: 1D#: Group #:  SCHEDULE INFORMATION: Date: Monday 04/04/20 Time: Location:[facility]

## 2020-02-05 ENCOUNTER — Other Ambulatory Visit: Payer: Self-pay | Admitting: Internal Medicine

## 2020-02-05 DIAGNOSIS — I10 Essential (primary) hypertension: Secondary | ICD-10-CM

## 2020-04-18 ENCOUNTER — Other Ambulatory Visit: Payer: Self-pay | Admitting: Internal Medicine

## 2020-04-18 DIAGNOSIS — I10 Essential (primary) hypertension: Secondary | ICD-10-CM

## 2020-04-18 NOTE — Telephone Encounter (Signed)
Requested Prescriptions  Pending Prescriptions Disp Refills  . losartan (COZAAR) 100 MG tablet [Pharmacy Med Name: LOSARTAN 100MG  TABLETS] 30 tablet 12    Sig: TAKE 1 TABLET(100 MG) BY MOUTH DAILY     Cardiovascular:  Angiotensin Receptor Blockers Passed - 04/18/2020  3:34 AM      Passed - Cr in normal range and within 180 days    Creatinine, Ser  Date Value Ref Range Status  01/22/2020 1.03 0.76 - 1.27 mg/dL Final         Passed - K in normal range and within 180 days    Potassium  Date Value Ref Range Status  01/22/2020 4.4 3.5 - 5.2 mmol/L Final         Passed - Patient is not pregnant      Passed - Last BP in normal range    BP Readings from Last 1 Encounters:  01/22/20 122/78         Passed - Valid encounter within last 6 months    Recent Outpatient Visits          2 months ago Annual physical exam   [facility] Glean Hess, MD   1 year ago Essential (primary) hypertension   [facility] Glean Hess, MD   1 year ago Annual physical exam   [facility] Glean Hess, MD   2 years ago Essential (primary) hypertension   [facility] Glean Hess, MD   3 years ago Low serum testosterone level   [facility] Glean Hess, MD      Future Appointments            In 9 months [facility] Melia Jesse Sans, MD [facility]

## 2020-05-04 ENCOUNTER — Encounter: Payer: Self-pay | Admitting: Gastroenterology

## 2020-05-04 ENCOUNTER — Other Ambulatory Visit: Payer: Self-pay

## 2020-05-12 ENCOUNTER — Other Ambulatory Visit
Admission: RE | Admit: 2020-05-12 | Discharge: 2020-05-12 | Disposition: A | Discharge status: Home / Self Care | Payer: BC Managed Care – PPO | Source: Ambulatory Visit | Attending: Gastroenterology | Admitting: Gastroenterology

## 2020-05-12 ENCOUNTER — Other Ambulatory Visit: Payer: Self-pay

## 2020-05-12 DIAGNOSIS — Z20822 Contact with and (suspected) exposure to covid-19: Secondary | ICD-10-CM | POA: Insufficient documentation

## 2020-05-12 DIAGNOSIS — Z01812 Encounter for preprocedural laboratory examination: Secondary | ICD-10-CM | POA: Insufficient documentation

## 2020-05-12 LAB — SARS CORONAVIRUS 2 (TAT 6-24 HRS): SARS Coronavirus 2: NEGATIVE

## 2020-05-13 NOTE — Discharge Instructions (Signed)
General Anesthesia, Adult, Care After This sheet gives you information about how to care for yourself after your procedure. Your health care provider may also give you more specific instructions. If you have problems or questions, contact your health care provider. What can I expect after the procedure? After the procedure, the following side effects are common:  Pain or discomfort at the IV site.  Nausea.  Vomiting.  Sore throat.  Trouble concentrating.  Feeling cold or chills.  Weak or tired.  Sleepiness and fatigue.  Soreness and body aches. These side effects can affect parts of the body that were not involved in surgery. Follow these instructions at home:  For at least 24 hours after the procedure:  Have a responsible adult stay with you. It is important to have someone help care for you until you are awake and alert.  Rest as needed.  Do not: ? Participate in activities in which you could fall or become injured. ? Drive. ? Use heavy machinery. ? Drink alcohol. ? Take sleeping pills or medicines that cause drowsiness. ? Make important decisions or sign legal documents. ? Take care of children on your own. Eating and drinking  Follow any instructions from your health care provider about eating or drinking restrictions.  When you feel hungry, start by eating small amounts of foods that are soft and easy to digest (bland), such as toast. Gradually return to your regular diet.  Drink enough fluid to keep your urine pale yellow.  If you vomit, rehydrate by drinking water, juice, or clear broth. General instructions  If you have sleep apnea, surgery and certain medicines can increase your risk for breathing problems. Follow instructions from your health care provider about wearing your sleep device: ? Anytime you are sleeping, including during daytime naps. ? While taking prescription pain medicines, sleeping medicines, or medicines that make you drowsy.  Return to  your normal activities as told by your health care provider. Ask your health care provider what activities are safe for you.  Take over-the-counter and prescription medicines only as told by your health care provider.  If you smoke, do not smoke without supervision.  Keep all follow-up visits as told by your health care provider. This is important. Contact a health care provider if:  You have nausea or vomiting that does not get better with medicine.  You cannot eat or drink without vomiting.  You have pain that does not get better with medicine.  You are unable to pass urine.  You develop a skin rash.  You have a fever.  You have redness around your IV site that gets worse. Get help right away if:  You have difficulty breathing.  You have chest pain.  You have blood in your urine or stool, or you vomit blood. Summary  After the procedure, it is common to have a sore throat or nausea. It is also common to feel tired.  Have a responsible adult stay with you for the first 24 hours after general anesthesia. It is important to have someone help care for you until you are awake and alert.  When you feel hungry, start by eating small amounts of foods that are soft and easy to digest (bland), such as toast. Gradually return to your regular diet.  Drink enough fluid to keep your urine pale yellow.  Return to your normal activities as told by your health care provider. Ask your health care provider what activities are safe for you. This information is not   intended to replace advice given to you by your health care provider. Make sure you discuss any questions you have with your health care provider. Document Revised: 11/29/2017 Document Reviewed: 07/12/2017 Elsevier Patient Education  2020 Elsevier Inc[employer]  

## 2020-05-16 ENCOUNTER — Encounter
Admission: RE | Disposition: A | Discharge status: Home / Self Care | Payer: Self-pay | Source: Home / Self Care | Attending: Gastroenterology

## 2020-05-16 ENCOUNTER — Encounter: Payer: Self-pay | Admitting: Gastroenterology

## 2020-05-16 ENCOUNTER — Ambulatory Visit: Payer: BC Managed Care – PPO | Admitting: Anesthesiology

## 2020-05-16 ENCOUNTER — Ambulatory Visit
Admission: RE | Admit: 2020-05-16 | Discharge: 2020-05-16 | Disposition: A | Discharge status: Home / Self Care | Payer: BC Managed Care – PPO | Attending: Gastroenterology | Admitting: Gastroenterology

## 2020-05-16 ENCOUNTER — Other Ambulatory Visit: Payer: Self-pay

## 2020-05-16 DIAGNOSIS — K635 Polyp of colon: Secondary | ICD-10-CM | POA: Diagnosis not present

## 2020-05-16 DIAGNOSIS — Z1211 Encounter for screening for malignant neoplasm of colon: Secondary | ICD-10-CM | POA: Diagnosis not present

## 2020-05-16 DIAGNOSIS — D124 Benign neoplasm of descending colon: Secondary | ICD-10-CM | POA: Diagnosis not present

## 2020-05-16 DIAGNOSIS — I10 Essential (primary) hypertension: Secondary | ICD-10-CM | POA: Insufficient documentation

## 2020-05-16 DIAGNOSIS — K640 First degree hemorrhoids: Secondary | ICD-10-CM | POA: Insufficient documentation

## 2020-05-16 DIAGNOSIS — Z79899 Other long term (current) drug therapy: Secondary | ICD-10-CM | POA: Insufficient documentation

## 2020-05-16 DIAGNOSIS — K219 Gastro-esophageal reflux disease without esophagitis: Secondary | ICD-10-CM | POA: Diagnosis not present

## 2020-05-16 HISTORY — DX: Cardiac murmur, unspecified: R01.1

## 2020-05-16 HISTORY — PX: POLYPECTOMY: SHX5525

## 2020-05-16 HISTORY — DX: Gastro-esophageal reflux disease without esophagitis: K21.9

## 2020-05-16 HISTORY — PX: COLONOSCOPY WITH PROPOFOL: SHX5780

## 2020-05-16 SURGERY — COLONOSCOPY WITH PROPOFOL
Anesthesia: General | Site: Rectum

## 2020-05-16 MED ORDER — STERILE WATER FOR IRRIGATION IR SOLN
Status: DC | PRN
  Administered 2020-05-16: 200 mL

## 2020-05-16 MED ORDER — LIDOCAINE HCL (CARDIAC) PF 100 MG/5ML IV SOSY
PREFILLED_SYRINGE | INTRAVENOUS | Status: DC | PRN
  Administered 2020-05-16: 30 mg via INTRAVENOUS

## 2020-05-16 MED ORDER — ACETAMINOPHEN 160 MG/5ML PO SOLN: 325.0000 mg | Freq: Once | ORAL | Status: DC

## 2020-05-16 MED ORDER — LACTATED RINGERS IV SOLN
10.0000 mL/h | INTRAVENOUS | Status: DC
  Administered 2020-05-16: 10 mL/h via INTRAVENOUS

## 2020-05-16 MED ORDER — PROPOFOL 10 MG/ML IV BOLUS
INTRAVENOUS | Status: DC | PRN
  Administered 2020-05-16: 150 mg via INTRAVENOUS
  Administered 2020-05-16: 20 mg via INTRAVENOUS
  Administered 2020-05-16 (×3): 30 mg via INTRAVENOUS

## 2020-05-16 MED ORDER — ACETAMINOPHEN 325 MG PO TABS: 325.0000 mg | ORAL_TABLET | Freq: Once | ORAL | Status: DC

## 2020-05-16 SURGICAL SUPPLY — 24 items
CLIP HMST 235XBRD CATH ROT (MISCELLANEOUS) IMPLANT
CLIP RESOLUTION 360 11X235 (MISCELLANEOUS)
ELECT REM PT RETURN 9FT ADLT (ELECTROSURGICAL)
ELECTRODE REM PT RTRN 9FT ADLT (ELECTROSURGICAL) IMPLANT
FCP ESCP3.2XJMB 240X2.8X (MISCELLANEOUS)
FORCEPS BIOP RAD 4 LRG CAP 4 (CUTTING FORCEPS) ×1 IMPLANT
FORCEPS BIOP RJ4 240 W/NDL (MISCELLANEOUS)
FORCEPS ESCP3.2XJMB 240X2.8X (MISCELLANEOUS) IMPLANT
GOWN CVR UNV OPN BCK APRN NK (MISCELLANEOUS) ×4 IMPLANT
GOWN ISOL THUMB LOOP REG UNIV (MISCELLANEOUS) ×2
INJECTOR VARIJECT VIN23 (MISCELLANEOUS) IMPLANT
KIT DEFENDO VALVE AND CONN (KITS) IMPLANT
KIT ENDO PROCEDURE OLY (KITS) ×3 IMPLANT
MANIFOLD NEPTUNE II (INSTRUMENTS) IMPLANT
MARKER SPOT ENDO TATTOO 5ML (MISCELLANEOUS) IMPLANT
PROBE APC STR FIRE (PROBE) IMPLANT
RETRIEVER NET ROTH 2.5X230 LF (MISCELLANEOUS) IMPLANT
SNARE SHORT THROW 13M SML OVAL (MISCELLANEOUS) ×1 IMPLANT
SNARE SHORT THROW 30M LRG OVAL (MISCELLANEOUS) IMPLANT
SNARE SNG USE RND 15MM (INSTRUMENTS) IMPLANT
SPOT EX ENDOSCOPIC TATTOO (MISCELLANEOUS)
TRAP ETRAP POLY (MISCELLANEOUS) ×1 IMPLANT
VARIJECT INJECTOR VIN23 (MISCELLANEOUS)
WATER STERILE IRR 250ML POUR (IV SOLUTION) ×3 IMPLANT

## 2020-05-16 NOTE — Anesthesia Preprocedure Evaluation (Signed)
Anesthesia Evaluation  Patient identified by MRN, date of birth, ID band Patient awake    Reviewed: Allergy & Precautions, H&P , NPO status , Patient's Chart, lab work & pertinent test results  Airway Mallampati: II  TM Distance: >3 FB Neck ROM: full    Dental no notable dental hx.    Pulmonary    Pulmonary exam normal breath sounds clear to auscultation       Cardiovascular hypertension, Normal cardiovascular exam Rhythm:regular Rate:Normal     Neuro/Psych    GI/Hepatic GERD  ,  Endo/Other    Renal/GU      Musculoskeletal   Abdominal   Peds  Hematology   Anesthesia Other Findings   Reproductive/Obstetrics                             Anesthesia Physical Anesthesia Plan  ASA: II  Anesthesia Plan: General   Post-op Pain Management:    Induction: Intravenous  PONV Risk Score and Plan: 2 and Treatment may vary due to age or medical condition, TIVA and Propofol infusion  Airway Management Planned: Natural Airway  Additional Equipment:   Intra-op Plan:   Post-operative Plan:   Informed Consent: I have reviewed the patients History and Physical, chart, labs and discussed the procedure including the risks, benefits and alternatives for the proposed anesthesia with the patient or authorized representative who has indicated his/her understanding and acceptance.     Dental Advisory Given  Plan Discussed with: CRNA  Anesthesia Plan Comments:         Anesthesia Quick Evaluation

## 2020-05-16 NOTE — Op Note (Signed)
[facility] Gastroenterology Patient Name: Jacob Blair Procedure Date: 05/16/2020 8:51 AM MRN: [id number] Account #: [id number] Date of Birth: [birth year] Admit Type: Outpatient Age: 49 Room: [facility] OR ROOM 01 Gender: Male Note Status: Finalized Procedure:             Colonoscopy Indications:           Screening for colorectal malignant neoplasm Providers:             Lucilla Lame MD, MD Referring MD:          Halina Maidens, MD (Referring MD) Medicines:             Propofol per Anesthesia Complications:         No immediate complications. Procedure:             Pre-Anesthesia Assessment:                        - Prior to the procedure, a History and Physical was                         performed, and patient medications and allergies were                         reviewed. The patient's tolerance of previous                         anesthesia was also reviewed. The risks and benefits                         of the procedure and the sedation options and risks                         were discussed with the patient. All questions were                         answered, and informed consent was obtained. Prior                         Anticoagulants: The patient has taken no previous                         anticoagulant or antiplatelet agents. ASA Grade                         Assessment: II - A patient with mild systemic disease.                         After reviewing the risks and benefits, the patient                         was deemed in satisfactory condition to undergo the                         procedure.                        After obtaining informed consent, the colonoscope was  passed under direct vision. Throughout the procedure,                         the patient's blood pressure, pulse, and oxygen                         saturations were monitored continuously. The                         Colonoscope was introduced through the anus  and                         advanced to the the cecum, identified by appendiceal                         orifice and ileocecal valve. The colonoscopy was                         performed without difficulty. The patient tolerated                         the procedure well. The quality of the bowel                         preparation was excellent. Findings:      The perianal and digital rectal examinations were normal.      Two sessile polyps were found in the descending colon. The polyps were 3       to 4 mm in size. These polyps were removed with a cold biopsy forceps.       Resection and retrieval were complete.      Non-bleeding internal hemorrhoids were found during retroflexion. The       hemorrhoids were Grade I (internal hemorrhoids that do not prolapse). Impression:            - Two 3 to 4 mm polyps in the descending colon,                         removed with a cold biopsy forceps. Resected and                         retrieved.                        - Non-bleeding internal hemorrhoids. Recommendation:        - Discharge patient to home.                        - Resume previous diet.                        - Continue present medications.                        - Await pathology results.                        - Repeat colonoscopy in 5 years if polyp adenoma and  10 years if hyperplastic Procedure Code(s):     --- Professional ---                        [phone], Colonoscopy, flexible; with biopsy, single or                         multiple Diagnosis Code(s):     --- Professional ---                        Z12.11, Encounter for screening for malignant neoplasm                         of colon                        K63.5, Polyp of colon CPT copyright 2019 American Medical Association. All rights reserved. The codes documented in this report are preliminary and upon coder review may  be revised to meet current compliance requirements. Lucilla Lame MD,  MD 05/16/2020 9:16:46 AM This report has been signed electronically. Number of Addenda: 0 Note Initiated On: 05/16/2020 8:51 AM Scope Withdrawal Time: 0 hours 8 minutes 28 seconds  Total Procedure Duration: 0 hours 9 minutes 41 seconds  Estimated Blood Loss:  Estimated blood loss: none.      [facility]

## 2020-05-16 NOTE — H&P (Signed)
Lucilla Lame, MD [facility] [address] [place], [place] [address] Phone: [phone] Fax : [phone]  Primary Care Physician:  Glean Hess, MD Primary Gastroenterologist:  Dr. Allen Norris  Pre-Procedure History & Physical: HPI:  Jacob Blair is a 49 y.o. male is here for a screening colonoscopy.   Past Medical History:  Diagnosis Date  . GERD (gastroesophageal reflux disease)   . Heart murmur    as a child  . Hypertension    controlled on meds    Past Surgical History:  Procedure Laterality Date  . COLONOSCOPY  12/12/2007  . UPPER GASTROINTESTINAL ENDOSCOPY  010209   small esophagus    Prior to Admission medications   Medication Sig Start Date End Date Taking? Authorizing Provider  fluticasone (FLONASE) 50 MCG/ACT nasal spray Place 2 sprays into both nostrils daily. 12/22/15  Yes Glean Hess, MD  hydrochlorothiazide (HYDRODIURIL) 25 MG tablet TAKE 1 TABLET BY MOUTH EVERY DAY 02/05/20  Yes Glean Hess, MD  losartan (COZAAR) 100 MG tablet TAKE 1 TABLET(100 MG) BY MOUTH DAILY 04/18/20  Yes Glean Hess, MD  Omeprazole 20 MG TBEC Take 1 tablet by mouth daily.   Yes [provider]  Probiotic Product (PROBIOTIC FORMULA PO) Take 1 packet by mouth daily.   Yes [provider]  psyllium (METAMUCIL) 58.6 % powder Take 1 packet by mouth daily.   Yes [provider]    Allergies as of 01/27/2020 - Review Complete 01/22/2020  Allergen Reaction Noted  . Ace inhibitors Cough 04/20/2015  . Peanut-containing drug products  04/20/2015  . Shellfish allergy  04/20/2015  . Amlodipine Rash 04/20/2015    Family History  Adopted: Yes    Social History   Socioeconomic History  . Marital status: Married    Spouse name: Not on file  . Number of children: Not on file  . Years of education: Not on file  . Highest education level: Not on file  Occupational History  . Not on file  Tobacco Use  . Smoking status: Never Smoker  .  Smokeless tobacco: Former Systems developer    Types: Chew  Substance and Sexual Activity  . Alcohol use: Yes    Alcohol/week: 4.0 standard drinks    Types: 4 Cans of beer per week    Comment: occasional  . Drug use: No  . Sexual activity: Yes  Other Topics Concern  . Not on file  Social History Narrative  . Not on file   Social Determinants of Health   Financial Resource Strain:   . Difficulty of Paying Living Expenses:   Food Insecurity:   . Worried About Charity fundraiser in the Last Year:   . Arboriculturist in the Last Year:   Transportation Needs:   . Film/video editor (Medical):   Marland Kitchen Lack of Transportation (Non-Medical):   Physical Activity:   . Days of Exercise per Week:   . Minutes of Exercise per Session:   Stress:   . Feeling of Stress :   Social Connections:   . Frequency of Communication with Friends and Family:   . Frequency of Social Gatherings with Friends and Family:   . Attends Religious Services:   . Active Member of Clubs or Organizations:   . Attends Archivist Meetings:   Marland Kitchen Marital Status:   Intimate Partner Violence:   . Fear of Current or Ex-Partner:   . Emotionally Abused:   Marland Kitchen Physically Abused:   .  Sexually Abused:     Review of Systems: See HPI, otherwise negative ROS  Physical Exam: BP 135/88   Pulse 84   Temp 97.6 F (36.4 C) (Temporal)   Resp 16   Ht 5\' 8"  (1.727 m)   Wt 82.1 kg   SpO2 100%   BMI 27.52 kg/m  General:   Alert,  pleasant and cooperative in NAD Head:  Normocephalic and atraumatic. Neck:  Supple; no masses or thyromegaly. Lungs:  Clear throughout to auscultation.    Heart:  Regular rate and rhythm. Abdomen:  Soft, nontender and nondistended. Normal bowel sounds, without guarding, and without rebound.   Neurologic:  Alert and  oriented x4;  grossly normal neurologically.  Impression/Plan: Jacob Blair is now here to undergo a screening colonoscopy.  Risks, benefits, and alternatives regarding colonoscopy  have been reviewed with the patient.  Questions have been answered.  All parties agreeable.

## 2020-05-16 NOTE — Transfer of Care (Signed)
Immediate Anesthesia Transfer of Care Note  Patient: Jacob Blair  Procedure(s) Performed: COLONOSCOPY WITH PROPOFOL (N/A Rectum) POLYPECTOMY (Rectum)  Patient Location: PACU  Anesthesia Type: General  Level of Consciousness: awake, alert  and patient cooperative  Airway and Oxygen Therapy: Patient Spontanous Breathing and Patient connected to supplemental oxygen  Post-op Assessment: Post-op Vital signs reviewed, Patient's Cardiovascular Status Stable, Respiratory Function Stable, Patent Airway and No signs of Nausea or vomiting  Post-op Vital Signs: Reviewed and stable  Complications: No apparent anesthesia complications

## 2020-05-16 NOTE — Anesthesia Postprocedure Evaluation (Signed)
Anesthesia Post Note  Patient: Jacob Blair  Procedure(s) Performed: COLONOSCOPY WITH PROPOFOL (N/A Rectum) POLYPECTOMY (Rectum)     Patient location during evaluation: PACU Anesthesia Type: General Level of consciousness: awake and alert and oriented Pain management: satisfactory to patient Vital Signs Assessment: post-procedure vital signs reviewed and stable Respiratory status: spontaneous breathing, nonlabored ventilation and respiratory function stable Cardiovascular status: blood pressure returned to baseline and stable Postop Assessment: Adequate PO intake and No signs of nausea or vomiting Anesthetic complications: no    Raliegh Ip

## 2020-05-16 NOTE — Anesthesia Procedure Notes (Signed)
Date/Time: 05/16/2020 9:02 AM Performed by: Cameron Ali, CRNA Pre-anesthesia Checklist: Patient identified, Emergency Drugs available, Suction available, Timeout performed and Patient being monitored Patient Re-evaluated:Patient Re-evaluated prior to induction Oxygen Delivery Method: Nasal cannula Placement Confirmation: positive ETCO2

## 2020-05-17 ENCOUNTER — Encounter: Payer: Self-pay | Admitting: *Deleted

## 2020-05-17 LAB — SURGICAL PATHOLOGY

## 2020-05-18 ENCOUNTER — Encounter: Payer: Self-pay | Admitting: Gastroenterology

## 2020-12-29 ENCOUNTER — Ambulatory Visit (INDEPENDENT_AMBULATORY_CARE_PROVIDER_SITE_OTHER): Payer: BC Managed Care – PPO

## 2020-12-29 ENCOUNTER — Ambulatory Visit
Admission: RE | Admit: 2020-12-29 | Discharge: 2020-12-29 | Disposition: A | Discharge status: Home / Self Care | Payer: BC Managed Care – PPO | Source: Ambulatory Visit | Attending: Sports Medicine | Admitting: Sports Medicine

## 2020-12-29 ENCOUNTER — Other Ambulatory Visit: Payer: Self-pay

## 2020-12-29 VITALS — BP 159/95 | HR 93 | Temp 98.7°F | Resp 18

## 2020-12-29 DIAGNOSIS — Z8616 Personal history of COVID-19: Secondary | ICD-10-CM | POA: Diagnosis not present

## 2020-12-29 DIAGNOSIS — M940 Chondrocostal junction syndrome [Tietze]: Secondary | ICD-10-CM | POA: Diagnosis not present

## 2020-12-29 DIAGNOSIS — R079 Chest pain, unspecified: Secondary | ICD-10-CM

## 2020-12-29 DIAGNOSIS — R0781 Pleurodynia: Secondary | ICD-10-CM

## 2020-12-29 NOTE — ED Triage Notes (Signed)
Pt states that he has left side pain. Pt states that it is more of a dull ache. Pt denies any SOB or direct chest pain. Pt states that he did test positive for covid two weeks ago and doesn't know if the pain could be associated with that. Pt states that he had a telehealth visit and was directed to come to [facility]

## 2020-12-29 NOTE — Discharge Instructions (Addendum)
Please see attached instructions.  Your history and exam is consistent with costochondritis.  Please take ibuprofen 600 mg 3 times a day scheduled for 5 to 7 days.  After that just take it as needed.  If your symptoms persist you may need advanced imaging including a CT scan.  If your symptoms worsen or do not improve please come back here or seek out medical attention for further evaluation.

## 2020-12-29 NOTE — ED Provider Notes (Signed)
[facility]    CSN: [id number] Arrival date & time: 12/29/20  0850      History   Chief Complaint Chief Complaint  Patient presents with  . Flank Pain    HPI Jacob Blair is a 50 y.o. male.   HPI  Past Medical History:  Diagnosis Date  . GERD (gastroesophageal reflux disease)   . Heart murmur    as a child  . Hypertension    controlled on meds    Patient Active Problem List   Diagnosis Date Noted  . Special screening for malignant neoplasms, colon   . Polyp of descending colon   . Hyperlipidemia, mild 07/24/2016  . Low serum testosterone level 07/24/2016  . Allergic state 04/20/2015  . Essential (primary) hypertension 04/20/2015  . Gastro-esophageal reflux disease without esophagitis 04/20/2015  . IBS (irritable bowel syndrome) 04/20/2015    Past Surgical History:  Procedure Laterality Date  . COLONOSCOPY  12/12/2007  . COLONOSCOPY WITH PROPOFOL N/A 05/16/2020   Procedure: COLONOSCOPY WITH PROPOFOL;  Surgeon: Lucilla Lame, MD;  Location: [place];  Service: Endoscopy;  Laterality: N/A;  prioirty 4  . POLYPECTOMY  05/16/2020   Procedure: POLYPECTOMY;  Surgeon: Lucilla Lame, MD;  Location: [place];  Service: Endoscopy;;  . UPPER GASTROINTESTINAL ENDOSCOPY  010209   small esophagus       Home Medications    Prior to Admission medications   Medication Sig Start Date End Date Taking? Authorizing Provider  fluticasone (FLONASE) 50 MCG/ACT nasal spray Place 2 sprays into both nostrils daily. 12/22/15   Glean Hess, MD  hydrochlorothiazide (HYDRODIURIL) 25 MG tablet TAKE 1 TABLET BY MOUTH EVERY DAY 02/05/20   Glean Hess, MD  losartan (COZAAR) 100 MG tablet TAKE 1 TABLET(100 MG) BY MOUTH DAILY 04/18/20   Glean Hess, MD  Omeprazole 20 MG TBEC Take 1 tablet by mouth daily.    [provider]  Probiotic Product (PROBIOTIC FORMULA PO) Take 1 packet by mouth daily.    [provider]  psyllium  (METAMUCIL) 58.6 % powder Take 1 packet by mouth daily.    [provider]    Family History Family History  Adopted: Yes    Social History Social History   Tobacco Use  . Smoking status: Never Smoker  . Smokeless tobacco: Former Systems developer    Types: Chew  Substance Use Topics  . Alcohol use: Yes    Alcohol/week: 4.0 standard drinks    Types: 4 Cans of beer per week    Comment: occasional  . Drug use: No     Allergies   Ace inhibitors, Peanut-containing drug products, Amlodipine, and Shellfish allergy   Review of Systems Review of Systems  Constitutional: Negative for chills, fatigue and fever.  HENT: Negative.   Eyes: Negative.   Respiratory: Negative for cough, chest tightness, shortness of breath, wheezing and stridor.   Cardiovascular: Positive for chest pain. Negative for palpitations.  Gastrointestinal: Negative.   Genitourinary: Negative.   Musculoskeletal:       Left-sided rib pain  Skin: Negative for color change, pallor, rash and wound.  Neurological: Negative.   All other systems reviewed and are negative.    Physical Exam Triage Vital Signs ED Triage Vitals  Enc Vitals Group     BP 12/29/20 0911 (!) 159/95     Pulse Rate 12/29/20 0911 93     Resp 12/29/20 0911 18     Temp 12/29/20 0911 98.7 F (37.1 C)  Temp Source 12/29/20 0911 Oral     SpO2 12/29/20 0911 100 %     Weight --      Height --      Head Circumference --      Peak Flow --      Pain Score 12/29/20 0909 2     Pain Loc --      Pain Edu? --      Excl. in [place]? --    No data found.  Updated Vital Signs BP (!) 159/95 (BP Location: Left Arm)   Pulse 93   Temp 98.7 F (37.1 C) (Oral)   Resp 18   SpO2 100%   Visual Acuity Right Eye Distance:   Left Eye Distance:   Bilateral Distance:    Right Eye Near:   Left Eye Near:    Bilateral Near:     Physical Exam Vitals and nursing note reviewed.  Constitutional:      General: He is not in acute distress.     Appearance: Normal appearance. He is not ill-appearing or toxic-appearing.  HENT:     Head: Normocephalic and atraumatic.  Eyes:     Extraocular Movements: Extraocular movements intact.     Pupils: Pupils are equal, round, and reactive to light.  Cardiovascular:     Rate and Rhythm: Normal rate and regular rhythm.     Pulses: Normal pulses.     Heart sounds: Normal heart sounds. No murmur heard. No friction rub. No gallop.   Pulmonary:     Effort: Pulmonary effort is normal. No respiratory distress.     Breath sounds: Normal breath sounds. No stridor. No wheezing, rhonchi or rales.  Musculoskeletal:     Cervical back: Normal range of motion and neck supple.     Comments: Right-sided ribs: Normal to inspection palpation range of motion special test.  Left-sided ribs: no obvious bony abnormality ecchymosis erythema soft tissue swelling.  There is no evidence of the rash.  No evidence of shingles present.  He is tender to palpation over several ribs mostly between the fourth fifth and sixth rib mid axillary line.  He is able to take a deep breath without any significant discomfort.  Bilateral shoulder exam: A little bit of decreased range of motion with internal rotation on the right side but otherwise exam is within normal limits without any strength deficits noted.  Skin:    General: Skin is warm and dry.     Capillary Refill: Capillary refill takes less than 2 seconds.  Neurological:     General: No focal deficit present.     Mental Status: He is alert and oriented to person, place, and time.      UC Treatments / Results  Labs (all labs ordered are listed, but only abnormal results are displayed) Labs Reviewed - No data to display  EKG   Radiology DG Chest 2 View  Result Date: 12/29/2020 CLINICAL DATA:  Chest pain.  History of COVID 3 weeks ago. EXAM: CHEST - 2 VIEW COMPARISON:  None. FINDINGS: The cardiac silhouette, mediastinal and hilar contours are within normal limits.  The lungs are clear. No pleural effusion or pulmonary lesions. The bony thorax is intact. IMPRESSION: No acute cardiopulmonary findings. Electronically Signed   By: Marijo Sanes M.D.   On: 12/29/2020 10:36    Procedures Procedures (including critical care time)  Medications Ordered in UC Medications - No data to display  Initial Impression / Assessment and Plan / UC Course  I  have reviewed the triage vital signs and the nursing notes.  Pertinent labs & imaging results that were available during my care of the patient were reviewed by me and considered in my medical decision making (see chart for details).  Clinical impression: Left-sided rib pain with a recent positive COVID test.  May be secondary to that but it seems most consistent with costochondritis after playing golf the day before his symptoms began.  Treatment plan: 1.  The findings and treatment plan were discussed in detail with the patient.  Patient was in agreement. 2.  Given his recent positive COVID test I felt it prudent to go ahead and just get a chest x-ray.  Results are above that showed no acute cardiopulmonary findings. 3.  His exam is consistent with costochondritis.  I gave him an educational handout on this.  It just supportive care with ibuprofen 600 mg 3 times a day for 5 to 7 days and then wean off it and see how he does. 4.  Does not appear to have any lung involvement so no further care there. 5.  Offered a work note but he works from home and he did not need one. 6.  Activity modification and supportive care was reinforced. 7.  If his symptoms persist and do not improve with supportive care he may need advanced imaging but for now we will discharge her from care and he will follow-up with [place] as needed.    Final Clinical Impressions(s) / UC Diagnoses   Final diagnoses:  Rib pain on left side  Costochondritis     Discharge Instructions     Please see attached instructions.  Your history and exam is  consistent with costochondritis.  Please take ibuprofen 600 mg 3 times a day scheduled for 5 to 7 days.  After that just take it as needed.  If your symptoms persist you may need advanced imaging including a CT scan.  If your symptoms worsen or do not improve please come back here or seek out medical attention for further evaluation.    ED Prescriptions    None     PDMP not reviewed this encounter.   Verda Cumins, MD 12/29/20 1230

## 2021-01-10 ENCOUNTER — Ambulatory Visit: Payer: BC Managed Care – PPO | Admitting: Internal Medicine

## 2021-01-24 ENCOUNTER — Encounter: Payer: BC Managed Care – PPO | Admitting: Internal Medicine

## 2021-02-21 ENCOUNTER — Other Ambulatory Visit: Payer: Self-pay | Admitting: Internal Medicine

## 2021-02-21 DIAGNOSIS — I10 Essential (primary) hypertension: Secondary | ICD-10-CM

## 2021-04-20 ENCOUNTER — Other Ambulatory Visit: Payer: Self-pay | Admitting: Internal Medicine

## 2021-04-20 DIAGNOSIS — I10 Essential (primary) hypertension: Secondary | ICD-10-CM

## 2021-05-03 ENCOUNTER — Encounter: Payer: Self-pay | Admitting: Internal Medicine

## 2021-05-03 ENCOUNTER — Other Ambulatory Visit: Payer: Self-pay

## 2021-05-03 ENCOUNTER — Ambulatory Visit (INDEPENDENT_AMBULATORY_CARE_PROVIDER_SITE_OTHER): Payer: BC Managed Care – PPO | Admitting: Internal Medicine

## 2021-05-03 VITALS — BP 132/78 | HR 93 | Temp 98.0°F | Ht 68.0 in | Wt 188.0 lb

## 2021-05-03 DIAGNOSIS — Z125 Encounter for screening for malignant neoplasm of prostate: Secondary | ICD-10-CM

## 2021-05-03 DIAGNOSIS — I10 Essential (primary) hypertension: Secondary | ICD-10-CM | POA: Diagnosis not present

## 2021-05-03 DIAGNOSIS — Z0000 Encounter for general adult medical examination without abnormal findings: Secondary | ICD-10-CM

## 2021-05-03 DIAGNOSIS — K219 Gastro-esophageal reflux disease without esophagitis: Secondary | ICD-10-CM | POA: Diagnosis not present

## 2021-05-03 DIAGNOSIS — Z1159 Encounter for screening for other viral diseases: Secondary | ICD-10-CM

## 2021-05-03 DIAGNOSIS — E785 Hyperlipidemia, unspecified: Secondary | ICD-10-CM | POA: Diagnosis not present

## 2021-05-03 LAB — POCT URINALYSIS DIPSTICK
Bilirubin, UA: NEGATIVE
Blood, UA: NEGATIVE
Glucose, UA: NEGATIVE
Ketones, UA: NEGATIVE
Leukocytes, UA: NEGATIVE
Nitrite, UA: NEGATIVE
Protein, UA: NEGATIVE
Spec Grav, UA: <=1.005 — AB (ref 1.010–1.025)
Urobilinogen, UA: 0.2 E.U./dL
pH, UA: 7 (ref 5.0–8.0)

## 2021-05-03 MED ORDER — LOSARTAN POTASSIUM 100 MG PO TABS: 100.0000 mg | ORAL_TABLET | Freq: Every day | ORAL | 3 refills | Status: DC

## 2021-05-03 NOTE — Patient Instructions (Signed)
You can try Allegra or Zyrtec in place of Claritin

## 2021-05-03 NOTE — Progress Notes (Signed)
Date:  05/03/2021   Name:  Jacob Blair   DOB:  1970/12/13   MRN:  [id number]   Chief Complaint: Annual Exam  Jacob Blair is a 50 y.o. male who presents today for his Complete Annual Exam. He feels well. He reports exercising rowing machine x2-3 times a week, walking 2-3 times a week and weight training. He reports he is sleeping well.   Colonoscopy: 05/2020  Immunization History  Administered Date(s) Administered  . Influenza Inj Mdck Quad Pf 01/16/2017, 11/28/2017  . Influenza,inj,Quad PF,6+ Mos 10/15/2018  . Influenza-Unspecified 09/10/2015, 11/28/2017  . PFIZER Comirnaty(Gray Top)Covid-19 Tri-Sucrose Vaccine 03/13/2020, 04/19/2020    Hypertension This is a chronic problem. The problem is controlled. Pertinent negatives include no chest pain, headaches, palpitations or shortness of breath. Past treatments include diuretics and angiotensin blockers. The current treatment provides significant improvement. There are no compliance problems.  There is no history of kidney disease, CAD/MI or CVA.  Gastroesophageal Reflux He complains of heartburn. He reports no abdominal pain, no chest pain, no choking or no wheezing. This is a recurrent problem. The problem occurs occasionally. The heartburn is located in the substernum. The heartburn is of mild intensity. Pertinent negatives include no fatigue. He has tried a PPI for the symptoms. The treatment provided significant relief.    Lab Results  Component Value Date   CREATININE 1.03 01/22/2020   BUN 14 01/22/2020   NA 141 01/22/2020   K 4.4 01/22/2020   CL 101 01/22/2020   CO2 24 01/22/2020   Lab Results  Component Value Date   CHOL 205 (H) 01/22/2020   HDL 49 01/22/2020   LDLCALC 139 (H) 01/22/2020   TRIG 93 01/22/2020   CHOLHDL 4.2 01/22/2020   Lab Results  Component Value Date   TSH 1.400 07/23/2016   No results found for: HGBA1C Lab Results  Component Value Date   WBC 6.0 01/22/2020   HGB 14.5 01/22/2020   HCT  41.7 01/22/2020   MCV 91 01/22/2020   PLT 336 01/22/2020   Lab Results  Component Value Date   ALT 37 01/22/2020   AST 23 01/22/2020   ALKPHOS 92 01/22/2020   BILITOT 0.3 01/22/2020     Review of Systems  Constitutional: Negative for appetite change, chills, diaphoresis, fatigue and unexpected weight change.  HENT: Negative for hearing loss, tinnitus, trouble swallowing and voice change.   Eyes: Negative for visual disturbance.  Respiratory: Negative for choking, shortness of breath and wheezing.   Cardiovascular: Negative for chest pain, palpitations and leg swelling.  Gastrointestinal: Positive for diarrhea (intermittently c/w IBS-D) and heartburn. Negative for abdominal pain, blood in stool and constipation.  Genitourinary: Negative for difficulty urinating, dysuria, frequency, hematuria, penile pain, penile swelling and urgency.       Early ejaculation recently   Musculoskeletal: Negative for arthralgias, back pain and myalgias.  Skin: Negative for color change and rash.  Neurological: Negative for dizziness, syncope and headaches.  Hematological: Negative for adenopathy.  Psychiatric/Behavioral: Negative for dysphoric mood and sleep disturbance. The patient is not nervous/anxious.     Patient Active Problem List   Diagnosis Date Noted  . Special screening for malignant neoplasms, colon   . Polyp of descending colon   . Hyperlipidemia, mild 07/24/2016  . Low serum testosterone level 07/24/2016  . Allergic state 04/20/2015  . Essential (primary) hypertension 04/20/2015  . Gastro-esophageal reflux disease without esophagitis 04/20/2015  . IBS (irritable bowel syndrome) 04/20/2015    Allergies  Allergen  Reactions  . Ace Inhibitors Cough  . Peanut-Containing Drug Products   . Amlodipine Rash  . Shellfish Allergy Swelling and Rash    Crab, throat closure    Past Surgical History:  Procedure Laterality Date  . COLONOSCOPY  12/12/2007  . COLONOSCOPY WITH PROPOFOL N/A  05/16/2020   Procedure: COLONOSCOPY WITH PROPOFOL;  Surgeon: Lucilla Lame, MD;  Location: [place];  Service: Endoscopy;  Laterality: N/A;  prioirty 4  . POLYPECTOMY  05/16/2020   Procedure: POLYPECTOMY;  Surgeon: Lucilla Lame, MD;  Location: [place];  Service: Endoscopy;;  . UPPER GASTROINTESTINAL ENDOSCOPY  010209   small esophagus    Social History   Tobacco Use  . Smoking status: Never Smoker  . Smokeless tobacco: Former Systems developer    Types: Chew  Substance Use Topics  . Alcohol use: Yes    Alcohol/week: 4.0 standard drinks    Types: 4 Cans of beer per week    Comment: occasional  . Drug use: No     Medication list has been reviewed and updated.  Current Meds  Medication Sig  . fluticasone (FLONASE) 50 MCG/ACT nasal spray Place 2 sprays into both nostrils daily.  . hydrochlorothiazide (HYDRODIURIL) 25 MG tablet TAKE 1 TABLET BY MOUTH EVERY DAY  . Loratadine (CLARITIN PO) Take by mouth.  . losartan (COZAAR) 100 MG tablet TAKE 1 TABLET(100 MG) BY MOUTH DAILY  . Omeprazole 20 MG TBEC Take 1 tablet by mouth daily.  . psyllium (METAMUCIL) 58.6 % powder Take 1 packet by mouth daily.    PHQ 2/9 Scores 05/03/2021 01/22/2020 10/20/2018 01/14/2018  PHQ - 2 Score 1 0 0 0  PHQ- 9 Score 3 0 - -    GAD 7 : Generalized Anxiety Score 05/03/2021  Nervous, Anxious, on Edge 0  Control/stop worrying 0  Worry too much - different things 1  Trouble relaxing 1  Restless 0  Easily annoyed or irritable 0  Afraid - awful might happen 0  Total GAD 7 Score 2  Anxiety Difficulty Not difficult at all    BP Readings from Last 3 Encounters:  05/03/21 132/78  12/29/20 (!) 159/95  05/16/20 112/72    Physical Exam Vitals and nursing note reviewed.  Constitutional:      Appearance: Normal appearance. He is well-developed.  HENT:     Head: Normocephalic.     Right Ear: Tympanic membrane, ear canal and external ear normal.     Left Ear: Tympanic membrane, ear canal and external  ear normal.     Nose: Nose normal.  Eyes:     Conjunctiva/sclera: Conjunctivae normal.     Pupils: Pupils are equal, round, and reactive to light.  Neck:     Thyroid: No thyromegaly.     Vascular: No carotid bruit.  Cardiovascular:     Rate and Rhythm: Normal rate and regular rhythm.     Heart sounds: Normal heart sounds.  Pulmonary:     Effort: Pulmonary effort is normal.     Breath sounds: Normal breath sounds. No wheezing.  Chest:  Breasts:     Right: No mass.     Left: No mass.    Abdominal:     General: Bowel sounds are normal.     Palpations: Abdomen is soft.     Tenderness: There is no abdominal tenderness.  Musculoskeletal:        General: Normal range of motion.     Cervical back: Normal range of motion and neck supple.  Right lower leg: No edema.     Left lower leg: No edema.  Lymphadenopathy:     Cervical: No cervical adenopathy.  Skin:    General: Skin is warm and dry.     Capillary Refill: Capillary refill takes less than 2 seconds.  Neurological:     General: No focal deficit present.     Mental Status: He is alert and oriented to person, place, and time.     Deep Tendon Reflexes: Reflexes are normal and symmetric.  Psychiatric:        Attention and Perception: Attention normal.        Mood and Affect: Mood normal.        Behavior: Behavior normal.     Wt Readings from Last 3 Encounters:  05/03/21 188 lb (85.3 kg)  05/16/20 181 lb (82.1 kg)  01/22/20 187 lb 9.6 oz (85.1 kg)    BP 132/78   Pulse 93   Temp 98 F (36.7 C) (Oral)   Ht 5\' 8"  (1.727 m)   Wt 188 lb (85.3 kg)   SpO2 97%   BMI 28.59 kg/m   Assessment and Plan: 1. Annual physical exam Normal exam - continue healthy diet, regular exercise Screenings are up to date. Vaccination are up to date. - VITAMIN D 25 Hydroxy (Vit-D Deficiency, Fractures)  2. Prostate cancer screening DRE deferred Mild problem with early ejaculation may need to be addressed in the future by Urology -  PSA  3. Need for hepatitis C screening test - Hepatitis C antibody  4. Essential (primary) hypertension Clinically stable exam with well controlled BP. Tolerating medications without side effects at this time. Pt to continue current regimen and low sodium diet; benefits of regular exercise as able discussed. - CBC with Differential/Platelet - Comprehensive metabolic panel - POCT urinalysis dipstick - losartan (COZAAR) 100 MG tablet; Take 1 tablet (100 mg total) by mouth daily.  Dispense: 90 tablet; Refill: 3  5. Gastro-esophageal reflux disease without esophagitis Symptoms well controlled on daily PPI No red flag signs such as weight loss, n/v, melena Will continue omeprazole. - CBC with Differential/Platelet  6. Hyperlipidemia, mild Check labs and advise. - Lipid panel   Partially dictated using Editor, commissioning. Any errors are unintentional.  Halina Maidens, MD [facility]  05/03/2021

## 2021-05-04 LAB — COMPREHENSIVE METABOLIC PANEL
ALT: 28 IU/L (ref 0–44)
AST: 21 IU/L (ref 0–40)
Albumin/Globulin Ratio: 1.9 (ref 1.2–2.2)
Albumin: 4.7 g/dL (ref 3.8–4.9)
Alkaline Phosphatase: 87 IU/L (ref 44–121)
BUN/Creatinine Ratio: 13 (ref 9–20)
BUN: 13 mg/dL (ref 6–24)
Bilirubin Total: 0.4 mg/dL (ref 0.0–1.2)
CO2: 25 mmol/L (ref 20–29)
Calcium: 9.5 mg/dL (ref 8.7–10.2)
Chloride: 102 mmol/L (ref 96–106)
Creatinine, Ser: 1.04 mg/dL (ref 0.76–1.27)
Globulin, Total: 2.5 g/dL (ref 1.5–4.5)
Glucose: 93 mg/dL (ref 65–99)
Potassium: 4.7 mmol/L (ref 3.5–5.2)
Sodium: 142 mmol/L (ref 134–144)
Total Protein: 7.2 g/dL (ref 6.0–8.5)
eGFR: 87 mL/min/{1.73_m2} (ref >59)

## 2021-05-04 LAB — HEPATITIS C ANTIBODY: Hep C Virus Ab: <0.1 s/co ratio (ref 0.0–0.9)

## 2021-05-04 LAB — CBC WITH DIFFERENTIAL/PLATELET
Basophils Absolute: 0 10*3/uL (ref 0.0–0.2)
Basos: 0 %
EOS (ABSOLUTE): 0.1 10*3/uL (ref 0.0–0.4)
Eos: 2 %
Hematocrit: 41.6 % (ref 37.5–51.0)
Hemoglobin: 14 g/dL (ref 13.0–17.7)
Immature Grans (Abs): 0 10*3/uL (ref 0.0–0.1)
Immature Granulocytes: 0 %
Lymphocytes Absolute: 1.3 10*3/uL (ref 0.7–3.1)
Lymphs: 26 %
MCH: 30.8 pg (ref 26.6–33.0)
MCHC: 33.7 g/dL (ref 31.5–35.7)
MCV: 92 fL (ref 79–97)
Monocytes Absolute: 0.7 10*3/uL (ref 0.1–0.9)
Monocytes: 13 %
Neutrophils Absolute: 2.9 10*3/uL (ref 1.4–7.0)
Neutrophils: 59 %
Platelets: 335 10*3/uL (ref 150–450)
RBC: 4.54 x10E6/uL (ref 4.14–5.80)
RDW: 12.8 % (ref 11.6–15.4)
WBC: 4.9 10*3/uL (ref 3.4–10.8)

## 2021-05-04 LAB — LIPID PANEL
Chol/HDL Ratio: 4.2 ratio (ref 0.0–5.0)
Cholesterol, Total: 208 mg/dL — ABNORMAL HIGH (ref 100–199)
HDL: 50 mg/dL (ref >39)
LDL Chol Calc (NIH): 139 mg/dL — ABNORMAL HIGH (ref 0–99)
Triglycerides: 105 mg/dL (ref 0–149)
VLDL Cholesterol Cal: 19 mg/dL (ref 5–40)

## 2021-05-04 LAB — VITAMIN D 25 HYDROXY (VIT D DEFICIENCY, FRACTURES): Vit D, 25-Hydroxy: 27.5 ng/mL — ABNORMAL LOW (ref 30.0–100.0)

## 2021-05-04 LAB — PSA: Prostate Specific Ag, Serum: 1.1 ng/mL (ref 0.0–4.0)

## 2021-05-25 IMAGING — CR DG CHEST 2V
2 series · 2 of 2 positions shown · non-contrast
Comparison: None.

CLINICAL DATA: Chest pain.  History of COVID 3 weeks ago.

EXAM:
CHEST - 2 VIEW

[chest pa]
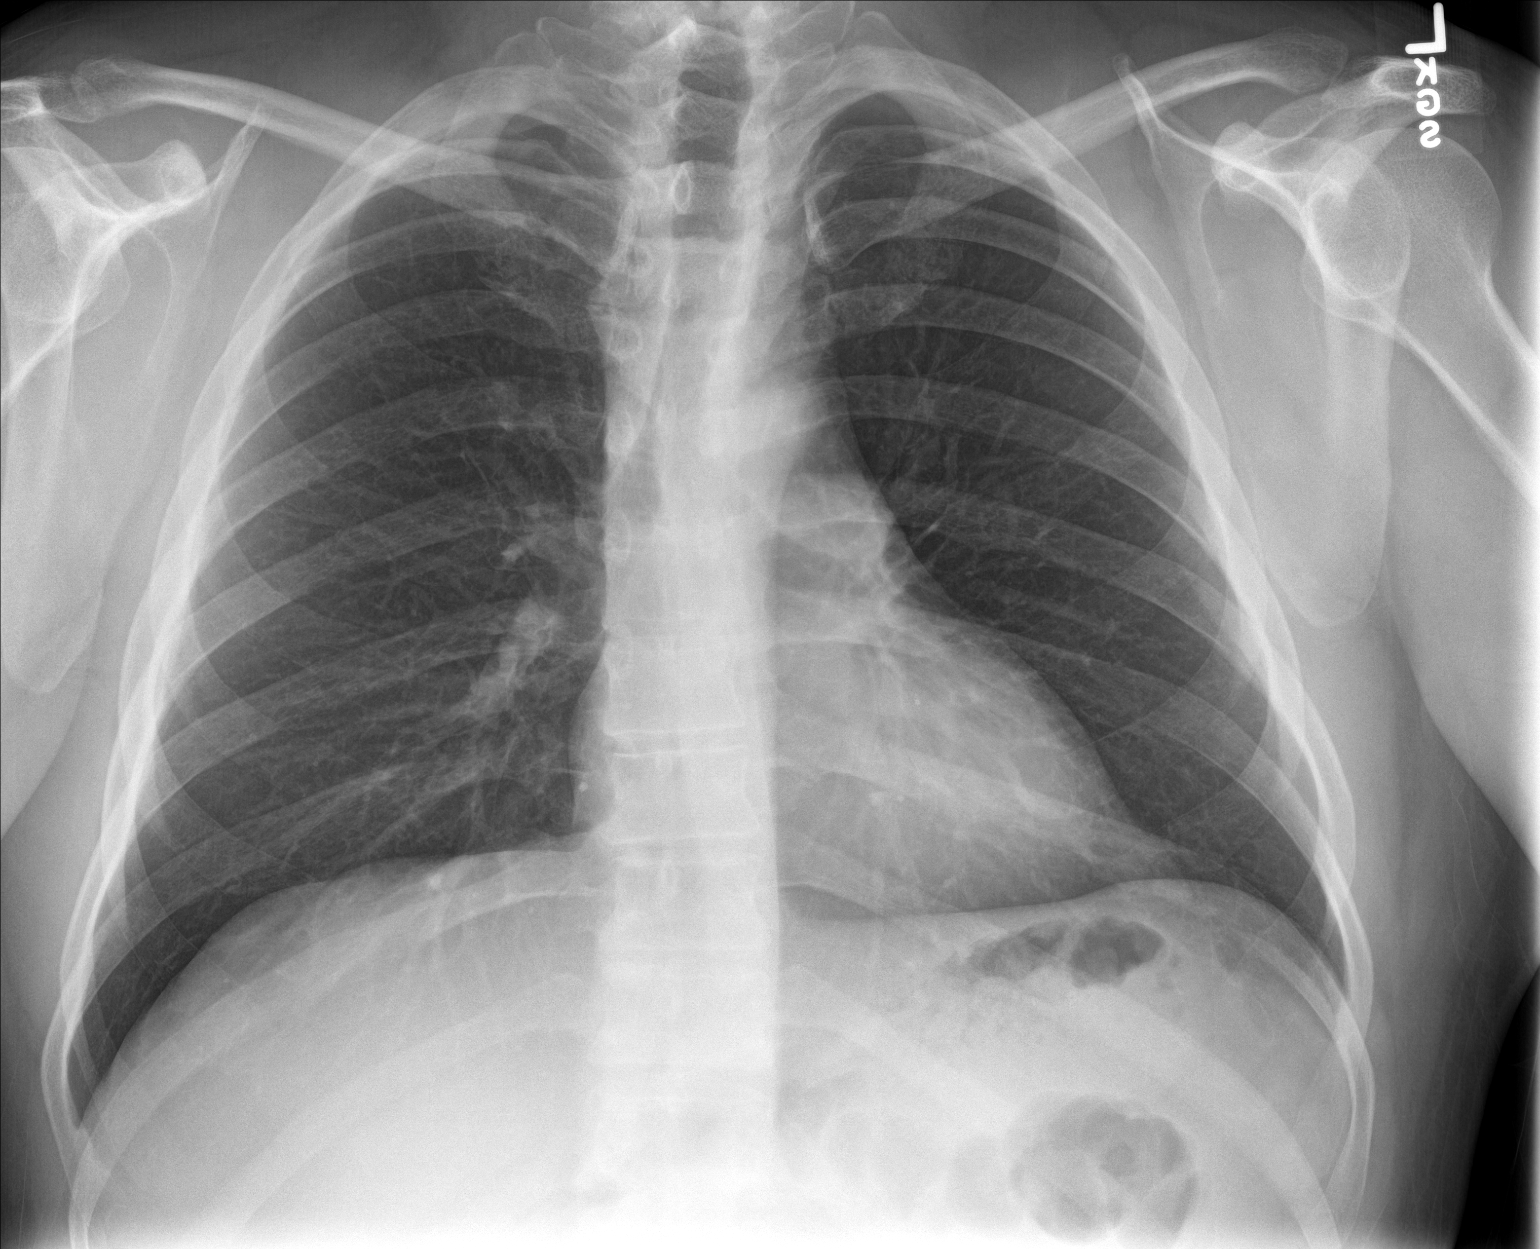

[chest lat]
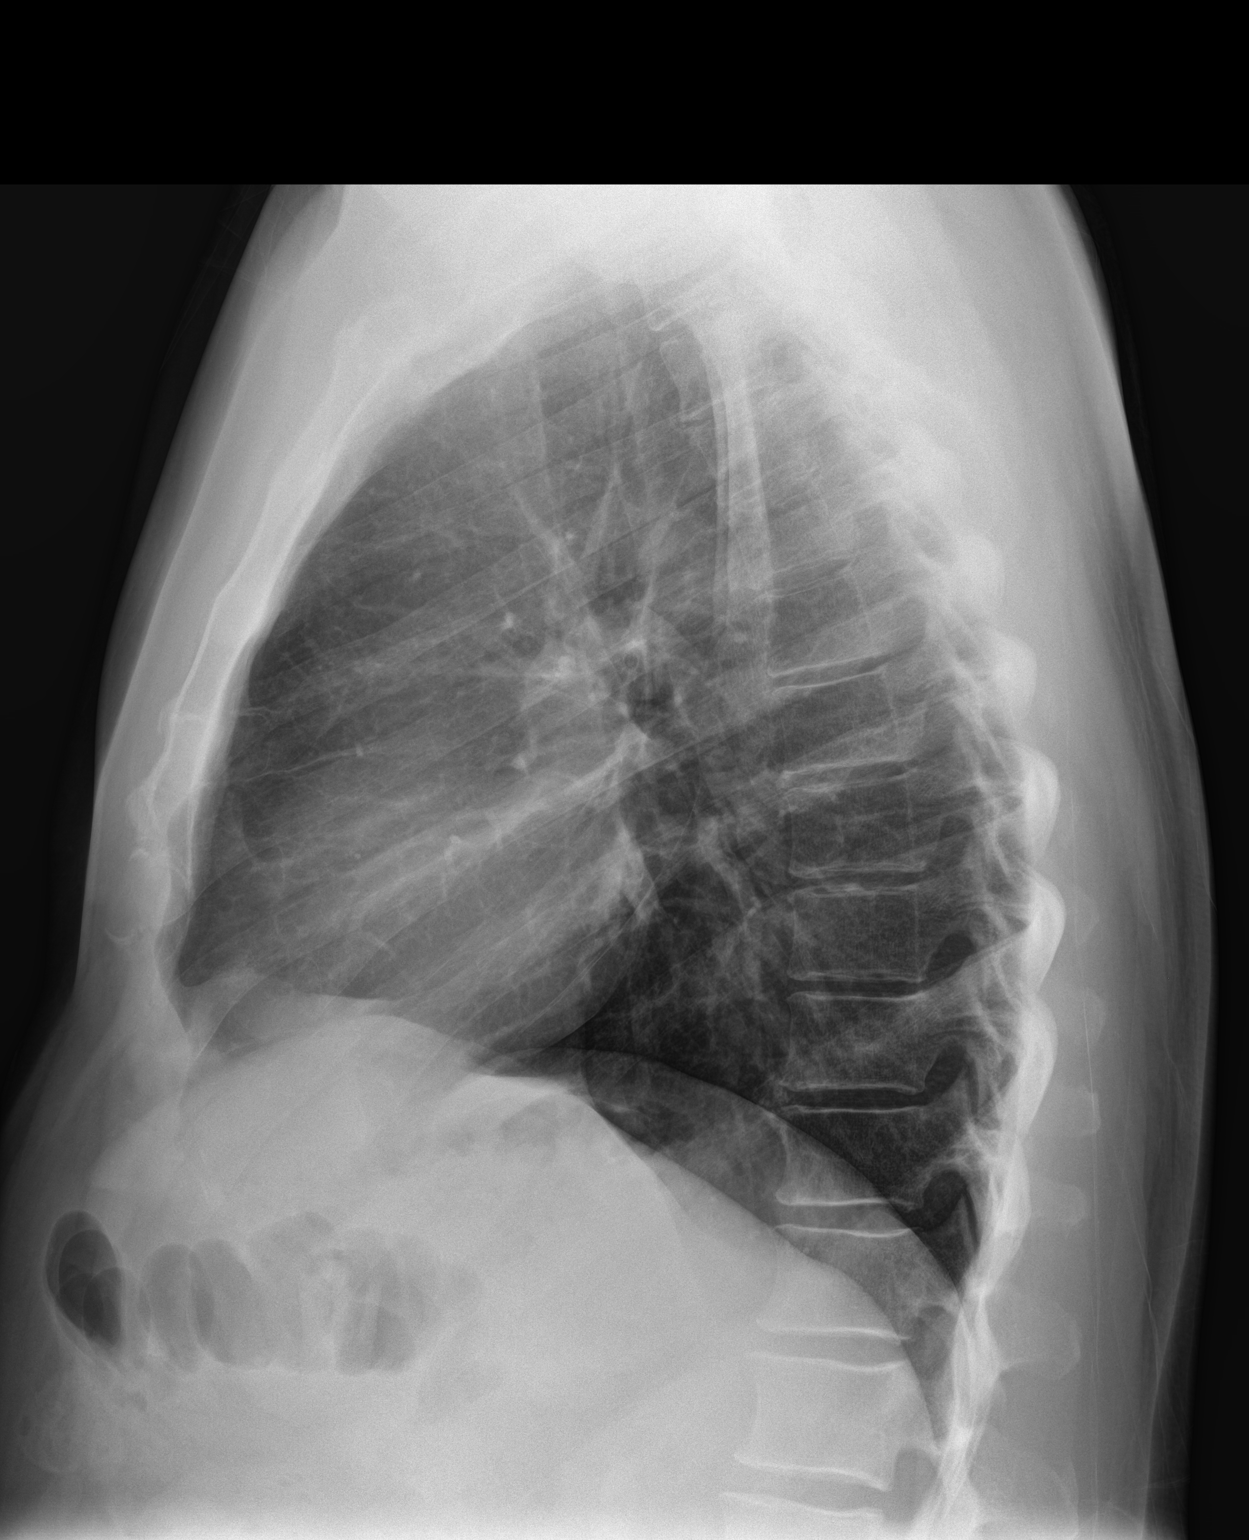

[2 of 2 positions shown; findings below may reference images not displayed]

FINDINGS: The cardiac silhouette, mediastinal and hilar contours are within
normal limits. The lungs are clear. No pleural effusion or pulmonary
lesions. The bony thorax is intact.
IMPRESSION: No acute cardiopulmonary findings.

## 2021-10-17 ENCOUNTER — Ambulatory Visit: Payer: BC Managed Care – PPO | Admitting: Internal Medicine

## 2021-11-10 ENCOUNTER — Ambulatory Visit: Payer: BC Managed Care – PPO | Admitting: Internal Medicine

## 2021-12-05 ENCOUNTER — Ambulatory Visit: Payer: BC Managed Care – PPO | Admitting: Internal Medicine

## 2021-12-13 ENCOUNTER — Other Ambulatory Visit: Payer: Self-pay | Admitting: Internal Medicine

## 2021-12-13 DIAGNOSIS — I10 Essential (primary) hypertension: Secondary | ICD-10-CM

## 2021-12-14 NOTE — Telephone Encounter (Signed)
Requested Prescriptions  Pending Prescriptions Disp Refills   hydrochlorothiazide (HYDRODIURIL) 25 MG tablet [Pharmacy Med Name: HYDROCHLOROTHIAZIDE 25MG  TABLETS] 30 tablet     Sig: TAKE 1 TABLET(25 MG) BY MOUTH DAILY     Cardiovascular: Diuretics - Thiazide Failed - 12/13/2021 10:13 AM      Failed - Valid encounter within last 6 months    Recent Outpatient Visits          7 months ago Annual physical exam   [facility] Glean Hess, MD   1 year ago Annual physical exam   [facility] Glean Hess, MD   2 years ago Essential (primary) hypertension   [facility] Glean Hess, MD   3 years ago Annual physical exam   [facility] Glean Hess, MD   3 years ago Essential (primary) hypertension   [place], [place]      Future Appointments            In 4 weeks Glean Hess, MD [facility], [place]   In 4 months Glean Hess, MD [facility], [place] in normal range and within 360 days    Calcium  Date Value Ref Range Status  05/03/2021 9.5 8.7 - 10.2 mg/dL Final         Passed - Cr in normal range and within 360 days    Creatinine, Ser  Date Value Ref Range Status  05/03/2021 1.04 0.76 - 1.27 mg/dL Final         Passed - K in normal range and within 360 days    Potassium  Date Value Ref Range Status  05/03/2021 4.7 3.5 - 5.2 mmol/L Final         Passed - Na in normal range and within 360 days    Sodium  Date Value Ref Range Status  05/03/2021 142 134 - 144 mmol/L Final         Passed - Last BP in normal range    BP Readings from Last 1 Encounters:  05/03/21 132/78         Patient must keep upcoming appointment for further refills.

## 2022-01-12 ENCOUNTER — Encounter: Payer: Self-pay | Admitting: Internal Medicine

## 2022-01-12 ENCOUNTER — Other Ambulatory Visit: Payer: Self-pay

## 2022-01-12 ENCOUNTER — Ambulatory Visit (INDEPENDENT_AMBULATORY_CARE_PROVIDER_SITE_OTHER): Payer: BC Managed Care – PPO | Admitting: Internal Medicine

## 2022-01-12 VITALS — BP 138/78 | HR 85 | Ht 68.0 in | Wt 193.0 lb

## 2022-01-12 DIAGNOSIS — K219 Gastro-esophageal reflux disease without esophagitis: Secondary | ICD-10-CM | POA: Diagnosis not present

## 2022-01-12 DIAGNOSIS — N529 Male erectile dysfunction, unspecified: Secondary | ICD-10-CM | POA: Insufficient documentation

## 2022-01-12 DIAGNOSIS — E559 Vitamin D deficiency, unspecified: Secondary | ICD-10-CM | POA: Insufficient documentation

## 2022-01-12 DIAGNOSIS — I10 Essential (primary) hypertension: Secondary | ICD-10-CM | POA: Diagnosis not present

## 2022-01-12 MED ORDER — SILDENAFIL CITRATE 100 MG PO TABS: 100.0000 mg | ORAL_TABLET | Freq: Every day | ORAL | 0 refills | Status: DC | PRN

## 2022-01-12 MED ORDER — OMEPRAZOLE 20 MG PO CPDR: 20.0000 mg | DELAYED_RELEASE_CAPSULE | Freq: Every day | ORAL | 3 refills | Status: DC

## 2022-01-12 NOTE — Progress Notes (Signed)
Date:  01/12/2022   Name:  Jacob Blair   DOB:  06/15/1971   MRN:  [id number]   Chief Complaint: Hypertension  Hypertension This is a chronic problem. The problem is controlled. Pertinent negatives include no chest pain, headaches, palpitations or shortness of breath. Past treatments include angiotensin blockers. The current treatment provides significant improvement. There are no compliance problems.  There is no history of kidney disease, CAD/MI or CVA.  Gastroesophageal Reflux He complains of heartburn. He reports no abdominal pain, no chest pain, no coughing or no wheezing. This is a recurrent problem. The problem occurs rarely. Pertinent negatives include no fatigue. He has tried a PPI for the symptoms.  ED - mild decrease in performance and concerned this could be related to low testosterone.  He used topicals in 2017 but did not notice much improvement.  Lab Results  Component Value Date   NA 142 05/03/2021   K 4.7 05/03/2021   CO2 25 05/03/2021   GLUCOSE 93 05/03/2021   BUN 13 05/03/2021   CREATININE 1.04 05/03/2021   CALCIUM 9.5 05/03/2021   EGFR 87 05/03/2021   GFRNONAA 84 01/22/2020   Lab Results  Component Value Date   CHOL 208 (H) 05/03/2021   HDL 50 05/03/2021   LDLCALC 139 (H) 05/03/2021   TRIG 105 05/03/2021   CHOLHDL 4.2 05/03/2021   Lab Results  Component Value Date   TSH 1.400 07/23/2016   No results found for: HGBA1C Lab Results  Component Value Date   WBC 4.9 05/03/2021   HGB 14.0 05/03/2021   HCT 41.6 05/03/2021   MCV 92 05/03/2021   PLT 335 05/03/2021   Lab Results  Component Value Date   ALT 28 05/03/2021   AST 21 05/03/2021   ALKPHOS 87 05/03/2021   BILITOT 0.4 05/03/2021   Lab Results  Component Value Date   VD25OH 27.5 (L) 05/03/2021     Review of Systems  Constitutional:  Negative for fatigue and unexpected weight change.  HENT:  Negative for nosebleeds.   Eyes:  Negative for visual disturbance.  Respiratory:  Negative for  cough, chest tightness, shortness of breath and wheezing.   Cardiovascular:  Negative for chest pain, palpitations and leg swelling.  Gastrointestinal:  Positive for heartburn. Negative for abdominal pain, constipation and diarrhea.  Genitourinary:  Negative for dysuria, frequency and hematuria.  Neurological:  Negative for dizziness, weakness, light-headedness and headaches.  Psychiatric/Behavioral:  Negative for dysphoric mood and sleep disturbance. The patient is not nervous/anxious.    Patient Active Problem List   Diagnosis Date Noted   Vitamin D deficiency 01/12/2022   Special screening for malignant neoplasms, colon    Polyp of descending colon    Hyperlipidemia, mild 07/24/2016   Low serum testosterone level 07/24/2016   Allergic state 04/20/2015   Essential (primary) hypertension 04/20/2015   Gastro-esophageal reflux disease without esophagitis 04/20/2015   IBS (irritable bowel syndrome) 04/20/2015    Allergies  Allergen Reactions   Ace Inhibitors Cough   Peanut-Containing Drug Products    Amlodipine Rash   Shellfish Allergy Swelling and Rash    Crab, throat closure    Past Surgical History:  Procedure Laterality Date   COLONOSCOPY  12/12/2007   COLONOSCOPY WITH PROPOFOL N/A 05/16/2020   Procedure: COLONOSCOPY WITH PROPOFOL;  Surgeon: Lucilla Lame, MD;  Location: [place];  Service: Endoscopy;  Laterality: N/A;  prioirty 4   POLYPECTOMY  05/16/2020   Procedure: POLYPECTOMY;  Surgeon: Lucilla Lame, MD;  Location: [place];  Service: Endoscopy;;   UPPER GASTROINTESTINAL ENDOSCOPY  010209   small esophagus    Social History   Tobacco Use   Smoking status: Never   Smokeless tobacco: Former    Types: Chew  Substance Use Topics   Alcohol use: Yes    Alcohol/week: 4.0 standard drinks    Types: 4 Cans of beer per week    Comment: occasional   Drug use: No     Medication list has been reviewed and updated.  Current Meds  Medication Sig    fluticasone (FLONASE) 50 MCG/ACT nasal spray Place 2 sprays into both nostrils daily.   hydrochlorothiazide (HYDRODIURIL) 25 MG tablet TAKE 1 TABLET(25 MG) BY MOUTH DAILY   losartan (COZAAR) 100 MG tablet Take 1 tablet (100 mg total) by mouth daily.   Omeprazole 20 MG TBEC Take 1 tablet by mouth daily.    PHQ 2/9 Scores 01/12/2022 05/03/2021 01/22/2020 10/20/2018  PHQ - 2 Score 0 1 0 0  PHQ- 9 Score 0 3 0 -    GAD 7 : Generalized Anxiety Score 01/12/2022 05/03/2021  Nervous, Anxious, on Edge 0 0  Control/stop worrying 0 0  Worry too much - different things 0 1  Trouble relaxing 0 1  Restless 0 0  Easily annoyed or irritable 0 0  Afraid - awful might happen 0 0  Total GAD 7 Score 0 2  Anxiety Difficulty - Not difficult at all    BP Readings from Last 3 Encounters:  01/12/22 138/78  05/03/21 132/78  12/29/20 (!) 159/95    Physical Exam Vitals and nursing note reviewed.  Constitutional:      General: He is not in acute distress.    Appearance: He is well-developed.  HENT:     Head: Normocephalic and atraumatic.  Cardiovascular:     Rate and Rhythm: Normal rate and regular rhythm.     Pulses: Normal pulses.     Heart sounds: No murmur heard. Pulmonary:     Effort: Pulmonary effort is normal. No respiratory distress.     Breath sounds: No wheezing or rhonchi.  Musculoskeletal:     Cervical back: Normal range of motion.     Right lower leg: No edema.     Left lower leg: No edema.  Lymphadenopathy:     Cervical: No cervical adenopathy.  Skin:    General: Skin is warm and dry.     Findings: No rash.  Neurological:     General: No focal deficit present.     Mental Status: He is alert and oriented to person, place, and time.  Psychiatric:        Mood and Affect: Mood normal.        Behavior: Behavior normal.    Wt Readings from Last 3 Encounters:  01/12/22 193 lb (87.5 kg)  05/03/21 188 lb (85.3 kg)  05/16/20 181 lb (82.1 kg)    BP 138/78    Pulse 85    Ht 5' 8"  (1.727 m)    Wt 193 lb (87.5 kg)    SpO2 97%    BMI 29.35 kg/m   Assessment and Plan: 1. Essential (primary) hypertension Clinically stable exam with well controlled BP. Tolerating medications without side effects at this time. Pt to continue current regimen and low sodium diet; benefits of regular exercise as able discussed.  2. Vitamin D deficiency Continue supplementation  3. Erectile dysfunction, unspecified erectile dysfunction type Will try low dose Viagra 25-50 mg  PRN Consider rechecking testosterone next visit - sildenafil (VIAGRA) 100 MG tablet; Take 1 tablet (100 mg total) by mouth daily as needed for erectile dysfunction.  Dispense: 5 tablet; Refill: 0  4. Gastro-esophageal reflux disease without esophagitis Symptoms well controlled on daily PPI No red flag signs such as weight loss, n/v, melena Will continue omeprazole. - omeprazole (PRILOSEC) 20 MG capsule; Take 1 capsule (20 mg total) by mouth daily.  Dispense: 90 capsule; Refill: 3   Partially dictated using Editor, commissioning. Any errors are unintentional.  Halina Maidens, MD [facility]  01/12/2022

## 2022-05-09 ENCOUNTER — Encounter: Payer: BC Managed Care – PPO | Admitting: Internal Medicine

## 2022-06-06 ENCOUNTER — Encounter: Payer: BC Managed Care – PPO | Admitting: Internal Medicine

## 2022-07-08 ENCOUNTER — Other Ambulatory Visit: Payer: Self-pay | Admitting: Internal Medicine

## 2022-07-08 DIAGNOSIS — I10 Essential (primary) hypertension: Secondary | ICD-10-CM

## 2022-07-09 ENCOUNTER — Other Ambulatory Visit: Payer: Self-pay | Admitting: Internal Medicine

## 2022-07-09 DIAGNOSIS — I10 Essential (primary) hypertension: Secondary | ICD-10-CM

## 2022-07-09 NOTE — Telephone Encounter (Signed)
Requested medications are due for refill today.  yes  Requested medications are on the active medications list.  yes  Last refill. 05/03/2021 #90 3 refills  Future visit scheduled.   yes  Notes to clinic.  Labs are expired.    Requested Prescriptions  Pending Prescriptions Disp Refills   losartan (COZAAR) 100 MG tablet [Pharmacy Med Name: LOSARTAN '100MG'$  TABLETS] 90 tablet 3    Sig: TAKE 1 TABLET(100 MG) BY MOUTH DAILY     Cardiovascular:  Angiotensin Receptor Blockers Failed - 07/08/2022  6:31 AM      Failed - Cr in normal range and within 180 days    Creatinine, Ser  Date Value Ref Range Status  05/03/2021 1.04 0.76 - 1.27 mg/dL Final         Failed - K in normal range and within 180 days    Potassium  Date Value Ref Range Status  05/03/2021 4.7 3.5 - 5.2 mmol/L Final         Passed - Patient is not pregnant      Passed - Last BP in normal range    BP Readings from Last 1 Encounters:  01/12/22 138/78         Passed - Valid encounter within last 6 months    Recent Outpatient Visits           5 months ago Essential (primary) hypertension   [facility] Glean Hess, MD   1 year ago Annual physical exam   [facility] Glean Hess, MD   2 years ago Annual physical exam   [facility] Glean Hess, MD   3 years ago Essential (primary) hypertension   [facility] Glean Hess, MD   3 years ago Annual physical exam   [facility] Glean Hess, MD       Future Appointments             In 2 months Army Melia Jesse Sans, MD [facility]

## 2022-07-10 NOTE — Telephone Encounter (Signed)
Requested medications are due for refill today.  yes  Requested medications are on the active medications list.  yes  Last refill. 12/14/2021 #90 0 refills  Future visit scheduled.   yes  Notes to clinic.  Labs are expired.    Requested Prescriptions  Pending Prescriptions Disp Refills   hydrochlorothiazide (HYDRODIURIL) 25 MG tablet [Pharmacy Med Name: HYDROCHLOROTHIAZIDE '25MG'$  TABLETS] 90 tablet 0    Sig: TAKE 1 TABLET(25 MG) BY MOUTH DAILY     Cardiovascular: Diuretics - Thiazide Failed - 07/09/2022  8:15 PM      Failed - Cr in normal range and within 180 days    Creatinine, Ser  Date Value Ref Range Status  05/03/2021 1.04 0.76 - 1.27 mg/dL Final         Failed - K in normal range and within 180 days    Potassium  Date Value Ref Range Status  05/03/2021 4.7 3.5 - 5.2 mmol/L Final         Failed - Na in normal range and within 180 days    Sodium  Date Value Ref Range Status  05/03/2021 142 134 - 144 mmol/L Final         Passed - Last BP in normal range    BP Readings from Last 1 Encounters:  01/12/22 138/78         Passed - Valid encounter within last 6 months    Recent Outpatient Visits           5 months ago Essential (primary) hypertension   [facility] Glean Hess, MD   1 year ago Annual physical exam   [facility] Glean Hess, MD   2 years ago Annual physical exam   [facility] Glean Hess, MD   3 years ago Essential (primary) hypertension   [facility] Glean Hess, MD   3 years ago Annual physical exam   [facility] Glean Hess, MD       Future Appointments             In 2 months Army Melia Jesse Sans, MD [facility], [facility]

## 2022-08-02 ENCOUNTER — Encounter: Payer: Self-pay | Admitting: Internal Medicine

## 2022-08-02 ENCOUNTER — Ambulatory Visit (INDEPENDENT_AMBULATORY_CARE_PROVIDER_SITE_OTHER): Payer: BC Managed Care – PPO | Admitting: Internal Medicine

## 2022-08-02 VITALS — BP 136/78 | HR 83 | Ht 68.0 in | Wt 189.0 lb

## 2022-08-02 DIAGNOSIS — R7989 Other specified abnormal findings of blood chemistry: Secondary | ICD-10-CM

## 2022-08-02 DIAGNOSIS — N529 Male erectile dysfunction, unspecified: Secondary | ICD-10-CM | POA: Diagnosis not present

## 2022-08-02 DIAGNOSIS — I10 Essential (primary) hypertension: Secondary | ICD-10-CM | POA: Diagnosis not present

## 2022-08-02 MED ORDER — SILDENAFIL CITRATE 100 MG PO TABS: 100.0000 mg | ORAL_TABLET | Freq: Every day | ORAL | 0 refills | Status: DC | PRN

## 2022-08-02 MED ORDER — HYDROCHLOROTHIAZIDE 25 MG PO TABS: ORAL_TABLET | ORAL | 1 refills | Status: DC

## 2022-08-02 NOTE — Progress Notes (Signed)
Date:  08/02/2022   Name:  Jacob Blair   DOB:  Sep 23, 1971   MRN:  [id number]   Chief Complaint: Hypertension  Hypertension This is a chronic problem. The problem is controlled. Pertinent negatives include no chest pain, headaches, palpitations or shortness of breath. Past treatments include angiotensin blockers and diuretics. The current treatment provides significant improvement. There is no history of kidney disease, CAD/MI or CVA.    Lab Results  Component Value Date   NA 142 05/03/2021   K 4.7 05/03/2021   CO2 25 05/03/2021   GLUCOSE 93 05/03/2021   BUN 13 05/03/2021   CREATININE 1.04 05/03/2021   CALCIUM 9.5 05/03/2021   EGFR 87 05/03/2021   GFRNONAA 84 01/22/2020   Lab Results  Component Value Date   CHOL 208 (H) 05/03/2021   HDL 50 05/03/2021   LDLCALC 139 (H) 05/03/2021   TRIG 105 05/03/2021   CHOLHDL 4.2 05/03/2021   Lab Results  Component Value Date   TSH 1.400 07/23/2016   No results found for: "HGBA1C" Lab Results  Component Value Date   WBC 4.9 05/03/2021   HGB 14.0 05/03/2021   HCT 41.6 05/03/2021   MCV 92 05/03/2021   PLT 335 05/03/2021   Lab Results  Component Value Date   ALT 28 05/03/2021   AST 21 05/03/2021   ALKPHOS 87 05/03/2021   BILITOT 0.4 05/03/2021   Lab Results  Component Value Date   VD25OH 27.5 (L) 05/03/2021     Review of Systems  Constitutional:  Negative for fatigue and unexpected weight change.  HENT:  Negative for nosebleeds.   Eyes:  Negative for visual disturbance.  Respiratory:  Negative for cough, chest tightness, shortness of breath and wheezing.   Cardiovascular:  Negative for chest pain, palpitations and leg swelling.  Gastrointestinal:  Negative for abdominal pain, constipation and diarrhea.  Neurological:  Negative for dizziness, weakness, light-headedness and headaches.    Patient Active Problem List   Diagnosis Date Noted   Vitamin D deficiency 01/12/2022   Erectile dysfunction 01/12/2022    Special screening for malignant neoplasms, colon    Polyp of descending colon    Hyperlipidemia, mild 07/24/2016   Low serum testosterone level 07/24/2016   Allergic state 04/20/2015   Essential (primary) hypertension 04/20/2015   Gastro-esophageal reflux disease without esophagitis 04/20/2015   IBS (irritable bowel syndrome) 04/20/2015    Allergies  Allergen Reactions   Ace Inhibitors Cough   Peanut-Containing Drug Products    Amlodipine Rash   Shellfish Allergy Swelling and Rash    Crab, throat closure    Past Surgical History:  Procedure Laterality Date   COLONOSCOPY  12/12/2007   COLONOSCOPY WITH PROPOFOL N/A 05/16/2020   Procedure: COLONOSCOPY WITH PROPOFOL;  Surgeon: Lucilla Lame, MD;  Location: [place];  Service: Endoscopy;  Laterality: N/A;  prioirty 4   POLYPECTOMY  05/16/2020   Procedure: POLYPECTOMY;  Surgeon: Lucilla Lame, MD;  Location: [place];  Service: Endoscopy;;   UPPER GASTROINTESTINAL ENDOSCOPY  010209   small esophagus    Social History   Tobacco Use   Smoking status: Never   Smokeless tobacco: Former    Types: Chew  Substance Use Topics   Alcohol use: Yes    Alcohol/week: 4.0 standard drinks of alcohol    Types: 4 Cans of beer per week    Comment: occasional   Drug use: No     Medication list has been reviewed and updated.  Current Meds  Medication Sig   fluticasone (FLONASE) 50 MCG/ACT nasal spray Place 2 sprays into both nostrils daily.   hydrochlorothiazide (HYDRODIURIL) 25 MG tablet TAKE 1 TABLET(25 MG) BY MOUTH DAILY   losartan (COZAAR) 100 MG tablet TAKE 1 TABLET(100 MG) BY MOUTH DAILY   omeprazole (PRILOSEC) 20 MG capsule Take 1 capsule (20 mg total) by mouth daily.   sildenafil (VIAGRA) 100 MG tablet Take 1 tablet (100 mg total) by mouth daily as needed for erectile dysfunction.       08/02/2022   10:51 AM 01/12/2022    9:25 AM 05/03/2021    8:40 AM  GAD 7 : Generalized Anxiety Score  Nervous, Anxious, on Edge  0 0 0  Control/stop worrying 0 0 0  Worry too much - different things 0 0 1  Trouble relaxing 0 0 1  Restless 0 0 0  Easily annoyed or irritable 0 0 0  Afraid - awful might happen 0 0 0  Total GAD 7 Score 0 0 2  Anxiety Difficulty Not difficult at all  Not difficult at all       08/02/2022   10:51 AM 01/12/2022    9:25 AM 05/03/2021    8:40 AM  Depression screen PHQ 2/9  Decreased Interest 0 0 0  Down, Depressed, Hopeless 0 0 1  PHQ - 2 Score 0 0 1  Altered sleeping 0 0 0  Tired, decreased energy 0 0 2  Change in appetite 0 0 0  Feeling bad or failure about yourself  0 0 0  Trouble concentrating 0 0 0  Moving slowly or fidgety/restless 0 0 0  Suicidal thoughts 0 0 0  PHQ-9 Score 0 0 3  Difficult doing work/chores Not difficult at all Not difficult at all Not difficult at all    BP Readings from Last 3 Encounters:  08/02/22 136/78  01/12/22 138/78  05/03/21 132/78    Physical Exam Vitals and nursing note reviewed.  Constitutional:      General: He is not in acute distress.    Appearance: He is well-developed.  HENT:     Head: Normocephalic and atraumatic.  Cardiovascular:     Rate and Rhythm: Normal rate and regular rhythm.     Pulses: Normal pulses.  Pulmonary:     Effort: Pulmonary effort is normal. No respiratory distress.     Breath sounds: No wheezing or rhonchi.  Musculoskeletal:     Cervical back: Normal range of motion.     Right lower leg: No edema.     Left lower leg: No edema.  Lymphadenopathy:     Cervical: No cervical adenopathy.  Skin:    General: Skin is warm and dry.     Findings: No rash.  Neurological:     Mental Status: He is alert and oriented to person, place, and time.  Psychiatric:        Mood and Affect: Mood normal.        Behavior: Behavior normal.     Wt Readings from Last 3 Encounters:  08/02/22 189 lb (85.7 kg)  01/12/22 193 lb (87.5 kg)  05/03/21 188 lb (85.3 kg)    BP 136/78   Pulse 83   Ht 5' 8"  (1.727 m)   Wt 189  lb (85.7 kg)   SpO2 98%   BMI 28.74 kg/m   Assessment and Plan: 1. Essential (primary) hypertension Clinically stable exam with well controlled BP. Tolerating medications without side effects at this time. Pt to continue current  regimen and low sodium diet; benefits of regular exercise as able discussed. - hydrochlorothiazide (HYDRODIURIL) 25 MG tablet; TAKE 1 TABLET(25 MG) BY MOUTH DAILY  Dispense: 90 tablet; Refill: 1  2. Erectile dysfunction, unspecified erectile dysfunction type He had low T in 2017 and used topical supplements for a while but did not benefit. He wants to have this checked again at his [facility] - sildenafil (VIAGRA) 100 MG tablet; Take 1 tablet (100 mg total) by mouth daily as needed for erectile dysfunction.  Dispense: 5 tablet; Refill: 0   Partially dictated using Editor, commissioning. Any errors are unintentional.  Halina Maidens, MD [facility]  08/02/2022

## 2022-08-16 ENCOUNTER — Encounter: Payer: Self-pay | Admitting: Internal Medicine

## 2022-08-16 ENCOUNTER — Ambulatory Visit (INDEPENDENT_AMBULATORY_CARE_PROVIDER_SITE_OTHER): Payer: BC Managed Care – PPO | Admitting: Internal Medicine

## 2022-08-16 VITALS — BP 128/74 | HR 83 | Ht 68.0 in | Wt 190.2 lb

## 2022-08-16 DIAGNOSIS — K582 Mixed irritable bowel syndrome: Secondary | ICD-10-CM

## 2022-08-16 NOTE — Progress Notes (Signed)
Date:  08/16/2022   Name:  Jacob Blair   DOB:  07/02/1971   MRN:  [id number]   Chief Complaint: Rash (Rash now healing, and itching. Behind right knee. Pt thinks he had shingles. Has not had the vaccine. Had shingles last year on his stomach. ) and Diarrhea (Every time patient has shingles rash, he issues with stomach. Diarrhea, and stomach pains. Has been diagnoses with IBS in the past. )  Diarrhea  This is a recurrent problem. The current episode started in the past 7 days. The problem has been gradually improving. The stool consistency is described as Watery. The patient states that diarrhea does not awaken him from sleep. Associated symptoms include increased flatus. Pertinent negatives include no arthralgias, bloating, chills, fever, vomiting or weight loss. Exacerbated by: seemed to flare up after he developed a rash similar to shingles.    Lab Results  Component Value Date   NA 142 05/03/2021   K 4.7 05/03/2021   CO2 25 05/03/2021   GLUCOSE 93 05/03/2021   BUN 13 05/03/2021   CREATININE 1.04 05/03/2021   CALCIUM 9.5 05/03/2021   EGFR 87 05/03/2021   GFRNONAA 84 01/22/2020   Lab Results  Component Value Date   CHOL 208 (H) 05/03/2021   HDL 50 05/03/2021   LDLCALC 139 (H) 05/03/2021   TRIG 105 05/03/2021   CHOLHDL 4.2 05/03/2021   Lab Results  Component Value Date   TSH 1.400 07/23/2016   No results found for: "HGBA1C" Lab Results  Component Value Date   WBC 4.9 05/03/2021   HGB 14.0 05/03/2021   HCT 41.6 05/03/2021   MCV 92 05/03/2021   PLT 335 05/03/2021   Lab Results  Component Value Date   ALT 28 05/03/2021   AST 21 05/03/2021   ALKPHOS 87 05/03/2021   BILITOT 0.4 05/03/2021   Lab Results  Component Value Date   VD25OH 27.5 (L) 05/03/2021     Review of Systems  Constitutional:  Negative for chills, fatigue, fever and weight loss.  Respiratory:  Negative for chest tightness and shortness of breath.   Cardiovascular:  Negative for chest pain.   Gastrointestinal:  Positive for diarrhea and flatus. Negative for anal bleeding, bloating, blood in stool, nausea and vomiting.       Mild cramping in the RLQ   Musculoskeletal:  Negative for arthralgias.  Psychiatric/Behavioral:  Negative for dysphoric mood and sleep disturbance. The patient is not nervous/anxious.     Patient Active Problem List   Diagnosis Date Noted   Vitamin D deficiency 01/12/2022   Erectile dysfunction 01/12/2022   Special screening for malignant neoplasms, colon    Polyp of descending colon    Hyperlipidemia, mild 07/24/2016   Low serum testosterone level 07/24/2016   Allergic state 04/20/2015   Essential (primary) hypertension 04/20/2015   Gastro-esophageal reflux disease without esophagitis 04/20/2015   IBS (irritable bowel syndrome) 04/20/2015    Allergies  Allergen Reactions   Ace Inhibitors Cough   Peanut-Containing Drug Products    Amlodipine Rash   Shellfish Allergy Swelling and Rash    Crab, throat closure    Past Surgical History:  Procedure Laterality Date   COLONOSCOPY  12/12/2007   COLONOSCOPY WITH PROPOFOL N/A 05/16/2020   Procedure: COLONOSCOPY WITH PROPOFOL;  Surgeon: Lucilla Lame, MD;  Location: [facility];  Service: Endoscopy;  Laterality: N/A;  prioirty 4   POLYPECTOMY  05/16/2020   Procedure: POLYPECTOMY;  Surgeon: Lucilla Lame, MD;  Location: [facility];  Service: Endoscopy;;   UPPER GASTROINTESTINAL ENDOSCOPY  010209   small esophagus    Social History   Tobacco Use   Smoking status: Never   Smokeless tobacco: Former    Types: Chew  Substance Use Topics   Alcohol use: Yes    Alcohol/week: 4.0 standard drinks of alcohol    Types: 4 Cans of beer per week    Comment: occasional   Drug use: No     Medication list has been reviewed and updated.  Current Meds  Medication Sig   fluticasone (FLONASE) 50 MCG/ACT nasal spray Place 2 sprays into both nostrils daily.   hydrochlorothiazide (HYDRODIURIL) 25  MG tablet TAKE 1 TABLET(25 MG) BY MOUTH DAILY   losartan (COZAAR) 100 MG tablet TAKE 1 TABLET(100 MG) BY MOUTH DAILY   omeprazole (PRILOSEC) 20 MG capsule Take 1 capsule (20 mg total) by mouth daily.   sildenafil (VIAGRA) 100 MG tablet Take 1 tablet (100 mg total) by mouth daily as needed for erectile dysfunction.       08/16/2022   11:20 AM 08/02/2022   10:51 AM 01/12/2022    9:25 AM 05/03/2021    8:40 AM  GAD 7 : Generalized Anxiety Score  Nervous, Anxious, on Edge 0 0 0 0  Control/stop worrying 0 0 0 0  Worry too much - different things 0 0 0 1  Trouble relaxing 0 0 0 1  Restless 0 0 0 0  Easily annoyed or irritable 0 0 0 0  Afraid - awful might happen 0 0 0 0  Total GAD 7 Score 0 0 0 2  Anxiety Difficulty Not difficult at all Not difficult at all  Not difficult at all       08/16/2022   11:20 AM 08/02/2022   10:51 AM 01/12/2022    9:25 AM  Depression screen PHQ 2/9  Decreased Interest 0 0 0  Down, Depressed, Hopeless 0 0 0  PHQ - 2 Score 0 0 0  Altered sleeping 0 0 0  Tired, decreased energy 0 0 0  Change in appetite 0 0 0  Feeling bad or failure about yourself  0 0 0  Trouble concentrating 0 0 0  Moving slowly or fidgety/restless 0 0 0  Suicidal thoughts 0 0 0  PHQ-9 Score 0 0 0  Difficult doing work/chores Not difficult at all Not difficult at all Not difficult at all    BP Readings from Last 3 Encounters:  08/16/22 128/74  08/02/22 136/78  01/12/22 138/78    Physical Exam Vitals and nursing note reviewed.  Constitutional:      General: He is not in acute distress.    Appearance: Normal appearance. He is well-developed. He is not ill-appearing.  HENT:     Head: Normocephalic and atraumatic.  Cardiovascular:     Rate and Rhythm: Normal rate and regular rhythm.  Pulmonary:     Effort: Pulmonary effort is normal. No respiratory distress.     Breath sounds: Normal breath sounds.  Abdominal:     General: Abdomen is flat. There is no distension.     Palpations:  Abdomen is soft. There is no mass.     Tenderness: There is no abdominal tenderness. There is no guarding.  Skin:    General: Skin is warm and dry.     Findings: No rash.  Neurological:     Mental Status: He is alert and oriented to person, place, and time.  Psychiatric:          Mood and Affect: Mood normal.        Behavior: Behavior normal.     Wt Readings from Last 3 Encounters:  08/16/22 190 lb 3.2 oz (86.3 kg)  08/02/22 189 lb (85.7 kg)  01/12/22 193 lb (87.5 kg)    BP 128/74   Pulse 83   Ht 5' 8" (1.727 m)   Wt 190 lb 3.2 oz (86.3 kg)   SpO2 96%   BMI 28.92 kg/m   Assessment and Plan: 1. Irritable bowel syndrome with both constipation and diarrhea Flare of IBS possibly triggered by zoster He is also changing jobs and has felt a bit more stressed with that. Recommend imodium and Gas-X daily as needed Symptoms of concern for re-evaluation discussed   Partially dictated using Dragon software. Any errors are unintentional.  Laura Berglund, MD [facility]  08/16/2022      

## 2022-08-21 ENCOUNTER — Ambulatory Visit: Payer: Self-pay | Admitting: *Deleted

## 2022-08-21 NOTE — Telephone Encounter (Signed)
He is not a patient at [facility].

## 2022-08-21 NOTE — Telephone Encounter (Signed)
Summary: Pt requests that a nurse return his call regarding possible shingles   Pt reports that he is getting another spot (possibly shingles) and it itches so he would like to speak with a nurse. Offered to schedule pt for an appt but he declined and requested call back from a nurse. Cb# [phone]        Chief Complaint: rash to inner wrist requesting if medication needed Symptoms: rash raised to inner wrist size of nickel. Itching , no burning. No tiny blisters noted.  Frequency: yesterday  Pertinent Negatives: Patient denies fever, no burning no drainage from site Disposition: '[]'$ ED /'[]'$ Urgent Care (no appt availability in office) / '[]'$ Appointment(In office/virtual)/ '[]'$  [facility]/ '[]'$ Home Care/ '[]'$ Refused Recommended Disposition /'[]'$ [facility]/ '[x]'$  Follow-up with PCP Additional Notes:   Patient reports rash appears the same as when he was seen by PCP  at last OV on 08/16/22 but no intestinal sx. Please advise if another OV needed or if patient can get medication to treat. Patient did not want to make appt until PCP notified if needed.  Please advise.        Reason for Disposition  [1] Severe localized itching AND [2] after 2 days of steroid cream    Has not used steroid cream and noted yesterday not 2 days ago  Answer Assessment - Initial Assessment Questions 1. APPEARANCE of RASH: "Describe the rash."      Raised tiny spots not fluid filled blisters to palm side of wrist  2. LOCATION: "Where is the rash located?"      Inner area of wrist  3. NUMBER: "How many spots are there?"      1 round area 4. SIZE: "How big are the spots?" (Inches, centimeters or compare to size of a coin)      Size of nickel 5. ONSET: "When did the rash start?"      Yesterday  6. ITCHING: "Does the rash itch?" If Yes, ask: "How bad is the itch?"  (Scale 0-10; or none, mild, moderate, severe)     Yes  7. PAIN: "Does the rash hurt?" If Yes, ask: "How bad is the pain?"  (Scale 0-10;  or none, mild, moderate, severe)    - NONE (0): no pain    - MILD (1-3): doesn't interfere with normal activities     - MODERATE (4-7): interferes with normal activities or awakens from sleep     - SEVERE (8-10): excruciating pain, unable to do any normal activities     No pain or burning  8. OTHER SYMPTOMS: "Do you have any other symptoms?" (e.g., fever)     Itching  9. PREGNANCY: "Is there any chance you are pregnant?" "When was your last menstrual period?"     na  Protocols used: Rash or Redness - Localized-A-AH

## 2022-08-22 ENCOUNTER — Encounter: Payer: Self-pay | Admitting: Internal Medicine

## 2022-08-22 ENCOUNTER — Ambulatory Visit (INDEPENDENT_AMBULATORY_CARE_PROVIDER_SITE_OTHER): Payer: BC Managed Care – PPO | Admitting: Internal Medicine

## 2022-08-22 VITALS — BP 110/78 | HR 76 | Ht 68.0 in | Wt 190.0 lb

## 2022-08-22 DIAGNOSIS — L247 Irritant contact dermatitis due to plants, except food: Secondary | ICD-10-CM

## 2022-08-22 NOTE — Progress Notes (Signed)
Date:  08/22/2022   Name:  Jacob Blair   DOB:  29-Dec-1970   MRN:  [id number]   Chief Complaint: Herpes Zoster (X1 day, rash raised to inner wrist bigger than a quarter,Itching , no burning.no tiny blisters noted, no drainage from site)  Rash This is a recurrent problem. Episode onset: X2 days. The problem has been gradually worsening since onset. The affected locations include the right wrist and right ankle. The rash is characterized by blistering, redness and itchiness. He was exposed to nothing. Pertinent negatives include no fatigue, fever or shortness of breath. Past treatments include nothing.    Lab Results  Component Value Date   NA 142 05/03/2021   K 4.7 05/03/2021   CO2 25 05/03/2021   GLUCOSE 93 05/03/2021   BUN 13 05/03/2021   CREATININE 1.04 05/03/2021   CALCIUM 9.5 05/03/2021   EGFR 87 05/03/2021   GFRNONAA 84 01/22/2020   Lab Results  Component Value Date   CHOL 208 (H) 05/03/2021   HDL 50 05/03/2021   LDLCALC 139 (H) 05/03/2021   TRIG 105 05/03/2021   CHOLHDL 4.2 05/03/2021   Lab Results  Component Value Date   TSH 1.400 07/23/2016   No results found for: "HGBA1C" Lab Results  Component Value Date   WBC 4.9 05/03/2021   HGB 14.0 05/03/2021   HCT 41.6 05/03/2021   MCV 92 05/03/2021   PLT 335 05/03/2021   Lab Results  Component Value Date   ALT 28 05/03/2021   AST 21 05/03/2021   ALKPHOS 87 05/03/2021   BILITOT 0.4 05/03/2021   Lab Results  Component Value Date   VD25OH 27.5 (L) 05/03/2021     Review of Systems  Constitutional:  Negative for chills, fatigue and fever.  Respiratory:  Negative for chest tightness and shortness of breath.   Skin:  Positive for rash.    Patient Active Problem List   Diagnosis Date Noted   Vitamin D deficiency 01/12/2022   Erectile dysfunction 01/12/2022   Special screening for malignant neoplasms, colon    Polyp of descending colon    Hyperlipidemia, mild 07/24/2016   Low serum testosterone level  07/24/2016   Allergic state 04/20/2015   Essential (primary) hypertension 04/20/2015   Gastro-esophageal reflux disease without esophagitis 04/20/2015   IBS (irritable bowel syndrome) 04/20/2015    Allergies  Allergen Reactions   Ace Inhibitors Cough   Peanut-Containing Drug Products    Amlodipine Rash   Shellfish Allergy Swelling and Rash    Crab, throat closure    Past Surgical History:  Procedure Laterality Date   COLONOSCOPY  12/12/2007   COLONOSCOPY WITH PROPOFOL N/A 05/16/2020   Procedure: COLONOSCOPY WITH PROPOFOL;  Surgeon: Lucilla Lame, MD;  Location: [facility];  Service: Endoscopy;  Laterality: N/A;  prioirty 4   POLYPECTOMY  05/16/2020   Procedure: POLYPECTOMY;  Surgeon: Lucilla Lame, MD;  Location: [facility];  Service: Endoscopy;;   UPPER GASTROINTESTINAL ENDOSCOPY  010209   small esophagus    Social History   Tobacco Use   Smoking status: Never   Smokeless tobacco: Former    Types: Chew  Substance Use Topics   Alcohol use: Yes    Alcohol/week: 4.0 standard drinks of alcohol    Types: 4 Cans of beer per week    Comment: occasional   Drug use: No     Medication list has been reviewed and updated.  Current Meds  Medication Sig   fluticasone (FLONASE) 50 MCG/ACT  nasal spray Place 2 sprays into both nostrils daily.   hydrochlorothiazide (HYDRODIURIL) 25 MG tablet TAKE 1 TABLET(25 MG) BY MOUTH DAILY   losartan (COZAAR) 100 MG tablet TAKE 1 TABLET(100 MG) BY MOUTH DAILY   omeprazole (PRILOSEC) 20 MG capsule Take 1 capsule (20 mg total) by mouth daily.   sildenafil (VIAGRA) 100 MG tablet Take 1 tablet (100 mg total) by mouth daily as needed for erectile dysfunction.       08/22/2022    3:30 PM 08/16/2022   11:20 AM 08/02/2022   10:51 AM 01/12/2022    9:25 AM  GAD 7 : Generalized Anxiety Score  Nervous, Anxious, on Edge 0 0 0 0  Control/stop worrying 0 0 0 0  Worry too much - different things 0 0 0 0  Trouble relaxing 0 0 0 0  Restless 0 0  0 0  Easily annoyed or irritable 0 0 0 0  Afraid - awful might happen 0 0 0 0  Total GAD 7 Score 0 0 0 0  Anxiety Difficulty Not difficult at all Not difficult at all Not difficult at all        08/22/2022    3:29 PM 08/16/2022   11:20 AM 08/02/2022   10:51 AM  Depression screen PHQ 2/9  Decreased Interest 0 0 0  Down, Depressed, Hopeless 0 0 0  PHQ - 2 Score 0 0 0  Altered sleeping 0 0 0  Tired, decreased energy 0 0 0  Change in appetite 0 0 0  Feeling bad or failure about yourself  0 0 0  Trouble concentrating 0 0 0  Moving slowly or fidgety/restless 0 0 0  Suicidal thoughts 0 0 0  PHQ-9 Score 0 0 0  Difficult doing work/chores Not difficult at all Not difficult at all Not difficult at all    BP Readings from Last 3 Encounters:  08/22/22 110/78  08/16/22 128/74  08/02/22 136/78    Physical Exam Vitals and nursing note reviewed.  Constitutional:      General: He is not in acute distress.    Appearance: He is well-developed.  HENT:     Head: Normocephalic and atraumatic.  Pulmonary:     Effort: Pulmonary effort is normal. No respiratory distress.  Skin:    General: Skin is warm and dry.     Findings: No rash.          Comments: Small patch of tiny blisters - itching but no redness or drainage.  No pain.  Neurological:     Mental Status: He is alert and oriented to person, place, and time.  Psychiatric:        Mood and Affect: Mood normal.        Behavior: Behavior normal.     Wt Readings from Last 3 Encounters:  08/22/22 190 lb (86.2 kg)  08/16/22 190 lb 3.2 oz (86.3 kg)  08/02/22 189 lb (85.7 kg)    BP 110/78   Pulse 76   Ht 5' 8"  (1.727 m)   Wt 190 lb (86.2 kg)   SpO2 96%   BMI 28.89 kg/m   Assessment and Plan: 1. Irritant contact dermatitis due to plants, except food Doubt this is Zoster I believe it is contact dermatitis from poison ivy likely from contact with a pet. Continue topical cortisone for itching.   Partially dictated using Radio producer. Any errors are unintentional.  Halina Maidens, MD [facility]  08/22/2022

## 2022-08-22 NOTE — Telephone Encounter (Signed)
Please review.  KP

## 2022-08-22 NOTE — Telephone Encounter (Signed)
Called pt scheduled an appt for today 08/22/22.  KP

## 2022-09-27 ENCOUNTER — Encounter: Payer: Self-pay | Admitting: Internal Medicine

## 2022-09-27 ENCOUNTER — Ambulatory Visit (INDEPENDENT_AMBULATORY_CARE_PROVIDER_SITE_OTHER): Payer: BC Managed Care – PPO | Admitting: Internal Medicine

## 2022-09-27 VITALS — BP 122/78 | HR 79 | Ht 68.0 in | Wt 193.2 lb

## 2022-09-27 DIAGNOSIS — Z0000 Encounter for general adult medical examination without abnormal findings: Secondary | ICD-10-CM | POA: Diagnosis not present

## 2022-09-27 DIAGNOSIS — E559 Vitamin D deficiency, unspecified: Secondary | ICD-10-CM

## 2022-09-27 DIAGNOSIS — R7989 Other specified abnormal findings of blood chemistry: Secondary | ICD-10-CM | POA: Diagnosis not present

## 2022-09-27 DIAGNOSIS — Z125 Encounter for screening for malignant neoplasm of prostate: Secondary | ICD-10-CM | POA: Diagnosis not present

## 2022-09-27 DIAGNOSIS — I10 Essential (primary) hypertension: Secondary | ICD-10-CM

## 2022-09-27 DIAGNOSIS — Z131 Encounter for screening for diabetes mellitus: Secondary | ICD-10-CM | POA: Diagnosis not present

## 2022-09-27 DIAGNOSIS — Z23 Encounter for immunization: Secondary | ICD-10-CM | POA: Diagnosis not present

## 2022-09-27 DIAGNOSIS — K219 Gastro-esophageal reflux disease without esophagitis: Secondary | ICD-10-CM

## 2022-09-27 DIAGNOSIS — E785 Hyperlipidemia, unspecified: Secondary | ICD-10-CM

## 2022-09-27 LAB — POCT URINALYSIS DIPSTICK
Bilirubin, UA: NEGATIVE
Blood, UA: NEGATIVE
Glucose, UA: NEGATIVE
Ketones, UA: NEGATIVE
Leukocytes, UA: NEGATIVE
Nitrite, UA: NEGATIVE
Protein, UA: NEGATIVE
Spec Grav, UA: 1.01 (ref 1.010–1.025)
Urobilinogen, UA: 0.2 E.U./dL
pH, UA: 6.5 (ref 5.0–8.0)

## 2022-09-27 MED ORDER — LOSARTAN POTASSIUM 100 MG PO TABS: ORAL_TABLET | ORAL | 3 refills | Status: DC

## 2022-09-27 NOTE — Progress Notes (Signed)
Date:  09/27/2022   Name:  [name]   DOB:  13-Dec-1970   MRN:  [id number]   Chief Complaint: Annual Exam IFEANYI Blair is a 51 y.o. male who presents today for his Complete Annual Exam. He feels well. He reports exercising. He reports he is sleeping well.   Colonoscopy: 05/2020  Immunization History  Administered Date(s) Administered   Influenza Inj Mdck Quad Pf 01/16/2017, 11/28/2017   Influenza,inj,Quad PF,6+ Mos 10/15/2018   Influenza-Unspecified 09/10/2015, 11/28/2017   PFIZER Comirnaty(Gray Top)Covid-19 Tri-Sucrose Vaccine 03/13/2020, 04/19/2020   Health Maintenance Due  Topic Date Due   TETANUS/TDAP  Never done   Zoster Vaccines- Shingrix (1 of 2) Never done    Lab Results  Component Value Date   PSA1 1.1 05/03/2021   PSA1 1.2 01/22/2020   PSA1 1.0 10/20/2018   PSA 1.0 07/12/2014    Hypertension This is a chronic problem. The problem is controlled. Pertinent negatives include no chest pain, headaches, palpitations or shortness of breath. Past treatments include angiotensin blockers and diuretics. The current treatment provides significant improvement. There is no history of kidney disease, CAD/MI or CVA.  Gastroesophageal Reflux He complains of heartburn. He reports no chest pain, no choking or no wheezing. Abdominal pain: still has RLQ discomfort off and on.This is a recurrent problem. The problem occurs occasionally. Pertinent negatives include no fatigue. He has tried a PPI for the symptoms. The treatment provided significant relief.  He still has some RLQ abdominal discomfort - no clear association with food or activity.  Colonoscopy in 2021 was normal except for one TA.  No diverticuli.  Lab Results  Component Value Date   NA 142 05/03/2021   K 4.7 05/03/2021   CO2 25 05/03/2021   GLUCOSE 93 05/03/2021   BUN 13 05/03/2021   CREATININE 1.04 05/03/2021   CALCIUM 9.5 05/03/2021   EGFR 87 05/03/2021   GFRNONAA 84 01/22/2020   Lab Results  Component  Value Date   CHOL 208 (H) 05/03/2021   HDL 50 05/03/2021   LDLCALC 139 (H) 05/03/2021   TRIG 105 05/03/2021   CHOLHDL 4.2 05/03/2021   Lab Results  Component Value Date   TSH 1.400 07/23/2016   No results found for: "HGBA1C" Lab Results  Component Value Date   WBC 4.9 05/03/2021   HGB 14.0 05/03/2021   HCT 41.6 05/03/2021   MCV 92 05/03/2021   PLT 335 05/03/2021   Lab Results  Component Value Date   ALT 28 05/03/2021   AST 21 05/03/2021   ALKPHOS 87 05/03/2021   BILITOT 0.4 05/03/2021   Lab Results  Component Value Date   VD25OH 27.5 (L) 05/03/2021     Review of Systems  Constitutional:  Negative for appetite change, chills, diaphoresis, fatigue and unexpected weight change.  HENT:  Negative for hearing loss, tinnitus, trouble swallowing and voice change.   Eyes:  Negative for visual disturbance.  Respiratory:  Negative for choking, shortness of breath and wheezing.   Cardiovascular:  Negative for chest pain, palpitations and leg swelling.  Gastrointestinal:  Positive for heartburn. Negative for blood in stool, constipation and diarrhea. Abdominal pain: still has RLQ discomfort off and on. Genitourinary:  Negative for difficulty urinating, dysuria and frequency.  Musculoskeletal:  Negative for arthralgias, back pain and myalgias.  Skin:  Negative for color change and rash.  Neurological:  Negative for dizziness, syncope and headaches.  Hematological:  Negative for adenopathy.  Psychiatric/Behavioral:  Negative for dysphoric mood and sleep  disturbance. The patient is not nervous/anxious.     Patient Active Problem List   Diagnosis Date Noted   Vitamin D deficiency 01/12/2022   Erectile dysfunction 01/12/2022   Polyp of descending colon    Hyperlipidemia, mild 07/24/2016   Low serum testosterone level 07/24/2016   Environmental and seasonal allergies 04/20/2015   Essential (primary) hypertension 04/20/2015   Gastro-esophageal reflux disease without esophagitis  04/20/2015   IBS (irritable bowel syndrome) 04/20/2015    Allergies  Allergen Reactions   Ace Inhibitors Cough   Peanut-Containing Drug Products    Amlodipine Rash   Shellfish Allergy Swelling and Rash    Crab, throat closure    Past Surgical History:  Procedure Laterality Date   COLONOSCOPY  12/12/2007   COLONOSCOPY WITH PROPOFOL N/A 05/16/2020   Procedure: COLONOSCOPY WITH PROPOFOL;  Surgeon: Lucilla Lame, MD;  Location: [place];  Service: Endoscopy;  Laterality: N/A;  prioirty 4   POLYPECTOMY  05/16/2020   Procedure: POLYPECTOMY;  Surgeon: Lucilla Lame, MD;  Location: [place];  Service: Endoscopy;;   UPPER GASTROINTESTINAL ENDOSCOPY  010209   small esophagus    Social History   Tobacco Use   Smoking status: Never   Smokeless tobacco: Former    Types: Chew  Substance Use Topics   Alcohol use: Yes    Alcohol/week: 4.0 standard drinks of alcohol    Types: 4 Cans of beer per week    Comment: occasional   Drug use: No     Medication list has been reviewed and updated.  Current Meds  Medication Sig   fluticasone (FLONASE) 50 MCG/ACT nasal spray Place 2 sprays into both nostrils daily.   hydrochlorothiazide (HYDRODIURIL) 25 MG tablet TAKE 1 TABLET(25 MG) BY MOUTH DAILY   omeprazole (PRILOSEC) 20 MG capsule Take 1 capsule (20 mg total) by mouth daily.   sildenafil (VIAGRA) 100 MG tablet Take 1 tablet (100 mg total) by mouth daily as needed for erectile dysfunction.   [DISCONTINUED] losartan (COZAAR) 100 MG tablet TAKE 1 TABLET(100 MG) BY MOUTH DAILY       09/27/2022    9:14 AM 08/22/2022    3:30 PM 08/16/2022   11:20 AM 08/02/2022   10:51 AM  GAD 7 : Generalized Anxiety Score  Nervous, Anxious, on Edge 0 0 0 0  Control/stop worrying 0 0 0 0  Worry too much - different things 0 0 0 0  Trouble relaxing 0 0 0 0  Restless 0 0 0 0  Easily annoyed or irritable 0 0 0 0  Afraid - awful might happen 0 0 0 0  Total GAD 7 Score 0 0 0 0  Anxiety  Difficulty Not difficult at all Not difficult at all Not difficult at all Not difficult at all       09/27/2022    9:14 AM 08/22/2022    3:29 PM 08/16/2022   11:20 AM  Depression screen PHQ 2/9  Decreased Interest 0 0 0  Down, Depressed, Hopeless 0 0 0  PHQ - 2 Score 0 0 0  Altered sleeping 0 0 0  Tired, decreased energy 0 0 0  Change in appetite 0 0 0  Feeling bad or failure about yourself  0 0 0  Trouble concentrating 0 0 0  Moving slowly or fidgety/restless 0 0 0  Suicidal thoughts 0 0 0  PHQ-9 Score 0 0 0  Difficult doing work/chores Not difficult at all Not difficult at all Not difficult at all  BP Readings from Last 3 Encounters:  09/27/22 122/78  08/22/22 110/78  08/16/22 128/74    Physical Exam Vitals and nursing note reviewed.  Constitutional:      Appearance: Normal appearance. He is well-developed.  HENT:     Head: Normocephalic.     Right Ear: Tympanic membrane, ear canal and external ear normal.     Left Ear: Tympanic membrane, ear canal and external ear normal.     Nose: Nose normal.  Eyes:     Conjunctiva/sclera: Conjunctivae normal.     Pupils: Pupils are equal, round, and reactive to light.  Neck:     Thyroid: No thyromegaly.     Vascular: No carotid bruit.  Cardiovascular:     Rate and Rhythm: Normal rate and regular rhythm.     Heart sounds: Normal heart sounds.  Pulmonary:     Effort: Pulmonary effort is normal.     Breath sounds: Normal breath sounds. No wheezing.  Chest:  Breasts:    Right: No mass.     Left: No mass.  Abdominal:     General: Bowel sounds are normal.     Palpations: Abdomen is soft.     Tenderness: There is no abdominal tenderness.  Musculoskeletal:        General: Normal range of motion.     Cervical back: Normal range of motion and neck supple.  Lymphadenopathy:     Cervical: No cervical adenopathy.  Skin:    General: Skin is warm and dry.  Neurological:     Mental Status: He is alert and oriented to person,  place, and time.     Deep Tendon Reflexes: Reflexes are normal and symmetric.  Psychiatric:        Attention and Perception: Attention normal.        Mood and Affect: Mood normal.        Thought Content: Thought content normal.     Wt Readings from Last 3 Encounters:  09/27/22 193 lb 3.2 oz (87.6 kg)  08/22/22 190 lb (86.2 kg)  08/16/22 190 lb 3.2 oz (86.3 kg)    BP 122/78 (BP Location: Left Arm, Patient Position: Sitting, Cuff Size: Normal)   Pulse 79   Ht 5' 8"  (1.727 m)   Wt 193 lb 3.2 oz (87.6 kg)   SpO2 98%   BMI 29.38 kg/m   Assessment and Plan: 1. Annual physical exam Normal exam. Continue healthy diet, exercise Up to date on screenings and immunizations. - CBC with Differential/Platelet - Comprehensive metabolic panel - Hemoglobin A1c - Lipid panel - PSA - VITAMIN D 25 Hydroxy (Vit-D Deficiency, Fractures)  2. Prostate cancer screening DRE deferred - PSA  3. Essential (primary) hypertension Clinically stable exam with well controlled BP. Tolerating medications without side effects at this time. Pt to continue current regimen and low sodium diet; regular exercise  - CBC with Differential/Platelet - Comprehensive metabolic panel - POCT urinalysis dipstick - losartan (COZAAR) 100 MG tablet; TAKE 1 TABLET(100 MG) BY MOUTH DAILY  Dispense: 90 tablet; Refill: 3  4. Gastro-esophageal reflux disease without esophagitis Taking omeprazole only as needed. No increase in frequency or change in symptoms  - CBC with Differential/Platelet  5. Hyperlipidemia, mild Will check labs and advise. 10 yr risk  of CAD 5% - Lipid panel  6. Vitamin D deficiency Taking supplement intermittently - recommend daily and will advise - VITAMIN D 25 Hydroxy (Vit-D Deficiency, Fractures)  7. Screening for diabetes mellitus - Hemoglobin A1c  8. Low  serum testosterone level Has tried topical supplement in the past and was not satisfied Would consider monthly injections due to  ongoing general fatigue, decreased energy, etc - will need 2 low testosterone levels before 10 AM - Testosterone   Partially dictated using [employer]. Any errors are unintentional.  Halina Maidens, MD [facility]  09/27/2022

## 2022-09-28 LAB — COMPREHENSIVE METABOLIC PANEL
ALT: 26 IU/L (ref 0–44)
AST: 21 IU/L (ref 0–40)
Albumin/Globulin Ratio: 1.6 (ref 1.2–2.2)
Albumin: 4.5 g/dL (ref 3.8–4.9)
Alkaline Phosphatase: 92 IU/L (ref 44–121)
BUN/Creatinine Ratio: 14 (ref 9–20)
BUN: 13 mg/dL (ref 6–24)
Bilirubin Total: 0.5 mg/dL (ref 0.0–1.2)
CO2: 25 mmol/L (ref 20–29)
Calcium: 9.5 mg/dL (ref 8.7–10.2)
Chloride: 99 mmol/L (ref 96–106)
Creatinine, Ser: 0.96 mg/dL (ref 0.76–1.27)
Globulin, Total: 2.8 g/dL (ref 1.5–4.5)
Glucose: 95 mg/dL (ref 70–99)
Potassium: 4.3 mmol/L (ref 3.5–5.2)
Sodium: 139 mmol/L (ref 134–144)
Total Protein: 7.3 g/dL (ref 6.0–8.5)
eGFR: 95 mL/min/{1.73_m2} (ref >59)

## 2022-09-28 LAB — HEMOGLOBIN A1C
Est. average glucose Bld gHb Est-mCnc: 126 mg/dL
Hgb A1c MFr Bld: 6 % — ABNORMAL HIGH (ref 4.8–5.6)

## 2022-09-28 LAB — CBC WITH DIFFERENTIAL/PLATELET
Basophils Absolute: 0 10*3/uL (ref 0.0–0.2)
Basos: 1 %
EOS (ABSOLUTE): 0.2 10*3/uL (ref 0.0–0.4)
Eos: 3 %
Hematocrit: 40.5 % (ref 37.5–51.0)
Hemoglobin: 13.9 g/dL (ref 13.0–17.7)
Immature Grans (Abs): 0 10*3/uL (ref 0.0–0.1)
Immature Granulocytes: 0 %
Lymphocytes Absolute: 1.8 10*3/uL (ref 0.7–3.1)
Lymphs: 30 %
MCH: 31.4 pg (ref 26.6–33.0)
MCHC: 34.3 g/dL (ref 31.5–35.7)
MCV: 92 fL (ref 79–97)
Monocytes Absolute: 0.5 10*3/uL (ref 0.1–0.9)
Monocytes: 9 %
Neutrophils Absolute: 3.5 10*3/uL (ref 1.4–7.0)
Neutrophils: 57 %
Platelets: 334 10*3/uL (ref 150–450)
RBC: 4.42 x10E6/uL (ref 4.14–5.80)
RDW: 12.1 % (ref 11.6–15.4)
WBC: 6.1 10*3/uL (ref 3.4–10.8)

## 2022-09-28 LAB — LIPID PANEL
Chol/HDL Ratio: 4.4 ratio (ref 0.0–5.0)
Cholesterol, Total: 215 mg/dL — ABNORMAL HIGH (ref 100–199)
HDL: 49 mg/dL (ref >39)
LDL Chol Calc (NIH): 144 mg/dL — ABNORMAL HIGH (ref 0–99)
Triglycerides: 121 mg/dL (ref 0–149)
VLDL Cholesterol Cal: 22 mg/dL (ref 5–40)

## 2022-09-28 LAB — TESTOSTERONE: Testosterone: 270 ng/dL (ref 264–916)

## 2022-09-28 LAB — VITAMIN D 25 HYDROXY (VIT D DEFICIENCY, FRACTURES): Vit D, 25-Hydroxy: 29 ng/mL — ABNORMAL LOW (ref 30.0–100.0)

## 2022-09-28 LAB — PSA: Prostate Specific Ag, Serum: 0.9 ng/mL (ref 0.0–4.0)

## 2022-10-06 ENCOUNTER — Other Ambulatory Visit: Payer: Self-pay | Admitting: Internal Medicine

## 2022-10-06 DIAGNOSIS — I10 Essential (primary) hypertension: Secondary | ICD-10-CM

## 2022-10-08 NOTE — Telephone Encounter (Signed)
Unable to refill per protocol, Rx was refilled 09/27/22 for 90 and 1 ref. Refill request is too soon, will refuse duplicate request.  Requested Prescriptions  Pending Prescriptions Disp Refills  . losartan (COZAAR) 100 MG tablet [Pharmacy Med Name: LOSARTAN '100MG'$  TABLETS] 90 tablet 3    Sig: TAKE 1 TABLET(100 MG) BY MOUTH DAILY     Cardiovascular:  Angiotensin Receptor Blockers Passed - 10/06/2022  6:20 AM      Passed - Cr in normal range and within 180 days    Creatinine, Ser  Date Value Ref Range Status  09/27/2022 0.96 0.76 - 1.27 mg/dL Final         Passed - K in normal range and within 180 days    Potassium  Date Value Ref Range Status  09/27/2022 4.3 3.5 - 5.2 mmol/L Final         Passed - Patient is not pregnant      Passed - Last BP in normal range    BP Readings from Last 1 Encounters:  09/27/22 122/78         Passed - Valid encounter within last 6 months    Recent Outpatient Visits          1 week ago Annual physical exam   [place] [facility] at [facility], Jesse Sans, MD   1 month ago Irritant contact dermatitis due to plants, except food   [place] [facility] at [facility], Jesse Sans, MD   1 month ago Irritable bowel syndrome with both constipation and diarrhea   [place] [facility] at [facility], Jesse Sans, MD   2 months ago Essential (primary) hypertension   [place] [facility] at [facility], Jesse Sans, MD   8 months ago Essential (primary) hypertension   [place] [facility] at [facility], Jesse Sans, MD      Future Appointments            In 5 months Army Melia, Jesse Sans, MD [facility] at [facility], [facility]

## 2022-11-26 ENCOUNTER — Ambulatory Visit
Admission: RE | Admit: 2022-11-26 | Discharge: 2022-11-26 | Disposition: A | Discharge status: Home / Self Care | Payer: BC Managed Care – PPO | Attending: Internal Medicine | Admitting: Internal Medicine

## 2022-11-26 ENCOUNTER — Ambulatory Visit
Admission: RE | Admit: 2022-11-26 | Discharge: 2022-11-26 | Disposition: A | Discharge status: Home / Self Care | Payer: BC Managed Care – PPO | Source: Ambulatory Visit | Attending: Internal Medicine | Admitting: Internal Medicine

## 2022-11-26 ENCOUNTER — Encounter: Payer: Self-pay | Admitting: Internal Medicine

## 2022-11-26 ENCOUNTER — Ambulatory Visit (INDEPENDENT_AMBULATORY_CARE_PROVIDER_SITE_OTHER): Payer: BC Managed Care – PPO | Admitting: Internal Medicine

## 2022-11-26 VITALS — BP 128/76 | HR 66 | Ht 68.0 in | Wt 181.0 lb

## 2022-11-26 DIAGNOSIS — R7303 Prediabetes: Secondary | ICD-10-CM

## 2022-11-26 DIAGNOSIS — M79644 Pain in right finger(s): Secondary | ICD-10-CM | POA: Diagnosis not present

## 2022-11-26 DIAGNOSIS — S6991XA Unspecified injury of right wrist, hand and finger(s), initial encounter: Secondary | ICD-10-CM | POA: Diagnosis not present

## 2022-11-26 NOTE — Patient Instructions (Signed)
Voltaren gel - Diclofenac gel - apply 4-6 times per day.

## 2022-11-26 NOTE — Progress Notes (Signed)
Date:  11/26/2022   Name:  Jacob Blair   DOB:  1971/03/03   MRN:  [id number]   Chief Complaint: Hand Pain  Hand Pain  Incident location: softball game. Injury mechanism: softball hit finger 03/2022. The pain is present in the right hand. The quality of the pain is described as aching and shooting. The pain does not radiate. The pain is at a severity of 6/10. The pain is mild. The pain has been Constant since the incident. Pertinent negatives include no chest pain. Associated symptoms comments: Loss of sensation. He has tried acetaminophen for the symptoms. The treatment provided mild relief.  Initial injury was a direct blow to the end of his right index by a baseball.  It was painful but not bruised and he was able to use it.  After a month the pain resolved.  About one month ago the finger started to be more painful and he now noticed that it is swollen.  Lab Results  Component Value Date   NA 139 09/27/2022   K 4.3 09/27/2022   CO2 25 09/27/2022   GLUCOSE 95 09/27/2022   BUN 13 09/27/2022   CREATININE 0.96 09/27/2022   CALCIUM 9.5 09/27/2022   EGFR 95 09/27/2022   GFRNONAA 84 01/22/2020   Lab Results  Component Value Date   CHOL 215 (H) 09/27/2022   HDL 49 09/27/2022   LDLCALC 144 (H) 09/27/2022   TRIG 121 09/27/2022   CHOLHDL 4.4 09/27/2022   Lab Results  Component Value Date   TSH 1.400 07/23/2016   Lab Results  Component Value Date   HGBA1C 6.0 (H) 09/27/2022   Lab Results  Component Value Date   WBC 6.1 09/27/2022   HGB 13.9 09/27/2022   HCT 40.5 09/27/2022   MCV 92 09/27/2022   PLT 334 09/27/2022   Lab Results  Component Value Date   ALT 26 09/27/2022   AST 21 09/27/2022   ALKPHOS 92 09/27/2022   BILITOT 0.5 09/27/2022   Lab Results  Component Value Date   VD25OH 29.0 (L) 09/27/2022     Review of Systems  Constitutional:  Positive for unexpected weight change (has lost 12 lbs with effort). Negative for chills and fatigue.  HENT:  Negative for  trouble swallowing.   Respiratory:  Negative for chest tightness and shortness of breath.   Cardiovascular:  Negative for chest pain.  Musculoskeletal:  Positive for arthralgias and joint swelling.  Psychiatric/Behavioral:  Negative for dysphoric mood and sleep disturbance. The patient is not nervous/anxious.     Patient Active Problem List   Diagnosis Date Noted   Vitamin D deficiency 01/12/2022   Erectile dysfunction 01/12/2022   Polyp of descending colon    Hyperlipidemia, mild 07/24/2016   Low serum testosterone level 07/24/2016   Environmental and seasonal allergies 04/20/2015   Essential (primary) hypertension 04/20/2015   Gastro-esophageal reflux disease without esophagitis 04/20/2015   IBS (irritable bowel syndrome) 04/20/2015    Allergies  Allergen Reactions   Ace Inhibitors Cough   Peanut-Containing Drug Products    Amlodipine Rash   Shellfish Allergy Swelling and Rash    Crab, throat closure    Past Surgical History:  Procedure Laterality Date   COLONOSCOPY  12/12/2007   COLONOSCOPY WITH PROPOFOL N/A 05/16/2020   Procedure: COLONOSCOPY WITH PROPOFOL;  Surgeon: Lucilla Lame, MD;  Location: [place];  Service: Endoscopy;  Laterality: N/A;  prioirty 4   POLYPECTOMY  05/16/2020   Procedure: POLYPECTOMY;  Surgeon: Allen Norris,  Darren, MD;  Location: [place];  Service: Endoscopy;;   UPPER GASTROINTESTINAL ENDOSCOPY  010209   small esophagus    Social History   Tobacco Use   Smoking status: Never   Smokeless tobacco: Former    Types: Chew  Substance Use Topics   Alcohol use: Yes    Alcohol/week: 4.0 standard drinks of alcohol    Types: 4 Cans of beer per week    Comment: occasional   Drug use: No     Medication list has been reviewed and updated.  Current Meds  Medication Sig   fluticasone (FLONASE) 50 MCG/ACT nasal spray Place 2 sprays into both nostrils daily.   hydrochlorothiazide (HYDRODIURIL) 25 MG tablet TAKE 1 TABLET(25 MG) BY MOUTH  DAILY   losartan (COZAAR) 100 MG tablet TAKE 1 TABLET(100 MG) BY MOUTH DAILY   omeprazole (PRILOSEC) 20 MG capsule Take 1 capsule (20 mg total) by mouth daily.   sildenafil (VIAGRA) 100 MG tablet Take 1 tablet (100 mg total) by mouth daily as needed for erectile dysfunction.   VITAMIN D PO Take by mouth daily.       11/26/2022    3:01 PM 09/27/2022    9:14 AM 08/22/2022    3:30 PM 08/16/2022   11:20 AM  GAD 7 : Generalized Anxiety Score  Nervous, Anxious, on Edge 0 0 0 0  Control/stop worrying 0 0 0 0  Worry too much - different things 0 0 0 0  Trouble relaxing 0 0 0 0  Restless 0 0 0 0  Easily annoyed or irritable 0 0 0 0  Afraid - awful might happen 0 0 0 0  Total GAD 7 Score 0 0 0 0  Anxiety Difficulty Not difficult at all Not difficult at all Not difficult at all Not difficult at all       11/26/2022    3:01 PM 09/27/2022    9:14 AM 08/22/2022    3:29 PM  Depression screen PHQ 2/9  Decreased Interest 0 0 0  Down, Depressed, Hopeless 0 0 0  PHQ - 2 Score 0 0 0  Altered sleeping 0 0 0  Tired, decreased energy 0 0 0  Change in appetite 0 0 0  Feeling bad or failure about yourself  0 0 0  Trouble concentrating 0 0 0  Moving slowly or fidgety/restless 0 0 0  Suicidal thoughts 0 0 0  PHQ-9 Score 0 0 0  Difficult doing work/chores Not difficult at all Not difficult at all Not difficult at all    BP Readings from Last 3 Encounters:  11/26/22 128/76  09/27/22 122/78  08/22/22 110/78    Physical Exam Vitals and nursing note reviewed.  Constitutional:      General: He is not in acute distress.    Appearance: He is well-developed.  HENT:     Head: Normocephalic and atraumatic.  Pulmonary:     Effort: Pulmonary effort is normal. No respiratory distress.  Musculoskeletal:     Right hand: Swelling (right second DIP) and tenderness present. Decreased range of motion.     Comments: C/w mucus cyst  Skin:    General: Skin is warm and dry.     Findings: No rash.   Neurological:     Mental Status: He is alert and oriented to person, place, and time.  Psychiatric:        Mood and Affect: Mood normal.        Behavior: Behavior normal.  Wt Readings from Last 3 Encounters:  11/26/22 181 lb (82.1 kg)  09/27/22 193 lb 3.2 oz (87.6 kg)  08/22/22 190 lb (86.2 kg)    BP 128/76   Pulse 66   Ht _0  (1.727 m)   Wt 181 lb (82.1 kg)   SpO2 97%   BMI 27.52 kg/m   Assessment and Plan: 1. Finger pain, right Suspect this is a mucus cyst and OA from the original injury. Recommend topical Voltaren qid - DG Finger Index Right  2. Prediabetes He is doing great with diet changes and has lost 12 lbs He will continue and recheck at next visit in April   Partially dictated using [employer]. Any errors are unintentional.  Halina Maidens, MD [facility]  11/26/2022

## 2022-12-21 ENCOUNTER — Ambulatory Visit
Admission: RE | Admit: 2022-12-21 | Discharge: 2022-12-21 | Disposition: A | Discharge status: Home / Self Care | Payer: BC Managed Care – PPO | Source: Ambulatory Visit | Attending: Family Medicine | Admitting: Family Medicine

## 2022-12-21 VITALS — BP 132/89 | HR 69 | Temp 98.1°F | Resp 15 | Ht 68.0 in | Wt 178.0 lb

## 2022-12-21 DIAGNOSIS — Z23 Encounter for immunization: Secondary | ICD-10-CM

## 2022-12-21 DIAGNOSIS — S61214A Laceration without foreign body of right ring finger without damage to nail, initial encounter: Secondary | ICD-10-CM | POA: Diagnosis not present

## 2022-12-21 MED ORDER — TETANUS-DIPHTH-ACELL PERTUSSIS 5-2.5-18.5 LF-MCG/0.5 IM SUSY
0.5000 mL | PREFILLED_SYRINGE | Freq: Once | INTRAMUSCULAR | Status: AC
  Administered 2022-12-21: 0.5 mL via INTRAMUSCULAR

## 2022-12-21 MED ORDER — DOXYCYCLINE HYCLATE 100 MG PO CAPS: 100.0000 mg | ORAL_CAPSULE | Freq: Two times a day (BID) | ORAL | 0 refills | Status: AC

## 2022-12-21 NOTE — ED Provider Notes (Signed)
[facility]    CSN: [id number] Arrival date & time: 12/21/22  1001      History   Chief Complaint Chief Complaint  Patient presents with   Finger Injury    Appointment Right 5th finger    HPI Jacob Blair is a 52 y.o. male.   HPI  Jacob Blair presents for right finger laceration that occurred while playing ping-pong on Monday.  States that he was swinging and hit his finger on the table injuring his right pinky.  The first couple days afterwards he just washed it and bandaged it up however about a day or so ago it started becoming more painful and swollen.  This morning he woke up and it was red on the lower area.  He has had no fever, nausea, vomiting, difficulty moving his hand or wrist.  His tetanus is not up-to-date.   Past Medical History:  Diagnosis Date   GERD (gastroesophageal reflux disease)    Heart murmur    as a child   Hypertension    controlled on meds    Patient Active Problem List   Diagnosis Date Noted   Vitamin D deficiency 01/12/2022   Erectile dysfunction 01/12/2022   Polyp of descending colon    Hyperlipidemia, mild 07/24/2016   Low serum testosterone level 07/24/2016   Environmental and seasonal allergies 04/20/2015   Essential (primary) hypertension 04/20/2015   Gastro-esophageal reflux disease without esophagitis 04/20/2015   IBS (irritable bowel syndrome) 04/20/2015    Past Surgical History:  Procedure Laterality Date   COLONOSCOPY  12/12/2007   COLONOSCOPY WITH PROPOFOL N/A 05/16/2020   Procedure: COLONOSCOPY WITH PROPOFOL;  Surgeon: Lucilla Lame, MD;  Location: [place];  Service: Endoscopy;  Laterality: N/A;  prioirty 4   POLYPECTOMY  05/16/2020   Procedure: POLYPECTOMY;  Surgeon: Lucilla Lame, MD;  Location: [place];  Service: Endoscopy;;   UPPER GASTROINTESTINAL ENDOSCOPY  010209   small esophagus       Home Medications    Prior to Admission medications   Medication Sig Start Date End Date Taking?  Authorizing Provider  doxycycline (VIBRAMYCIN) 100 MG capsule Take 1 capsule (100 mg total) by mouth 2 (two) times daily for 7 days. 12/21/22 12/28/22 Yes Aneliz Carbary, DO  fluticasone (FLONASE) 50 MCG/ACT nasal spray Place 2 sprays into both nostrils daily. 12/22/15   Glean Hess, MD  hydrochlorothiazide (HYDRODIURIL) 25 MG tablet TAKE 1 TABLET(25 MG) BY MOUTH DAILY 08/02/22   Glean Hess, MD  losartan (COZAAR) 100 MG tablet TAKE 1 TABLET(100 MG) BY MOUTH DAILY 09/27/22   Glean Hess, MD  omeprazole (PRILOSEC) 20 MG capsule Take 1 capsule (20 mg total) by mouth daily. 01/12/22   Glean Hess, MD  sildenafil (VIAGRA) 100 MG tablet Take 1 tablet (100 mg total) by mouth daily as needed for erectile dysfunction. 08/02/22   Glean Hess, MD  VITAMIN D PO Take by mouth daily.    [provider]    Family History Family History  Adopted: Yes    Social History Social History   Tobacco Use   Smoking status: Never   Smokeless tobacco: Former    Types: Nurse, children's Use: Never used  Substance Use Topics   Alcohol use: Yes    Alcohol/week: 4.0 standard drinks of alcohol    Types: 4 Cans of beer per week    Comment: occasional   Drug use: No     Allergies  Ace inhibitors, Peanut-containing drug products, Amlodipine, and Shellfish allergy   Review of Systems Review of Systems :negative unless otherwise stated in HPI.      Physical Exam Triage Vital Signs ED Triage Vitals  Enc Vitals Group     BP 12/21/22 1048 132/89     Pulse Rate 12/21/22 1048 69     Resp 12/21/22 1048 15     Temp 12/21/22 1048 98.1 F (36.7 C)     Temp Source 12/21/22 1048 Oral     SpO2 12/21/22 1048 100 %     Weight 12/21/22 1045 178 lb (80.7 kg)     Height 12/21/22 1045 '5\' 8"'$  (1.727 m)     Head Circumference --      Peak Flow --      Pain Score 12/21/22 1045 2     Pain Loc --      Pain Edu? --      Excl. in [place]? --    No data found.  Updated Vital  Signs BP 132/89 (BP Location: Left Arm)   Pulse 69   Temp 98.1 F (36.7 C) (Oral)   Resp 15   Ht '5\' 8"'$  (1.727 m)   Wt 80.7 kg   SpO2 100%   BMI 27.06 kg/m   Visual Acuity Right Eye Distance:   Left Eye Distance:   Bilateral Distance:    Right Eye Near:   Left Eye Near:    Bilateral Near:     Physical Exam  GEN: alert, well appearing male, in no acute distress  EYES: extra occular movements intact, no scleral injection CV: regular rate, radial pulse palpable  RESP: no increased work of breathing MSK: right 5th finger mild edema without joint tenderness, no gross deformities NEURO: alert, moves all extremities appropriately, normal gait PSYCH: Normal affect, appropriate speech and behavior  SKIN: warm and dry; right 5th finger erythema with honey crusting and tenderness over 1 cm laceration between PIP and DIP joint and 0.5 cm laceration over distal phalange     UC Treatments / Results  Labs (all labs ordered are listed, but only abnormal results are displayed) Labs Reviewed - No data to display  EKG   Radiology No results found.  Procedures Procedures (including critical care time)  Medications Ordered in UC Medications  Tdap (BOOSTRIX) injection 0.5 mL (0.5 mLs Intramuscular Given 12/21/22 1147)    Initial Impression / Assessment and Plan / UC Course  I have reviewed the triage vital signs and the nursing notes.  Pertinent labs & imaging results that were available during my care of the patient were reviewed by me and considered in my medical decision making (see chart for details).     Patient is a 52 y.o. malewho presents for laceration that occurred 4-5 days ago.  Overall, patient is well-appearing and well-hydrated.  Vital signs stable.  Jacob Blair is afebrile.  He has lacerations that are beyond the window for repair.  They will heal by secondary intention. Exam concerning for cellulitis.  Treat with antibiotics as below. Tetanus updated prior to discharge.     Reviewed expectations regarding course of current medical issues.  All questions asked were answered.  Outlined signs and symptoms indicating need for more acute intervention. Patient verbalized understanding. After Visit Summary given.   Final Clinical Impressions(s) / UC Diagnoses   Final diagnoses:  Laceration of right ring finger without damage to nail, foreign body presence unspecified, initial encounter     Discharge Instructions  Stop by the pharmacy to pick up your prescriptions.  Follow up with your primary care provider as needed.      ED Prescriptions     Medication Sig Dispense Auth. Provider   doxycycline (VIBRAMYCIN) 100 MG capsule Take 1 capsule (100 mg total) by mouth 2 (two) times daily for 7 days. 14 capsule Azaiah Mello, DO      PDMP not reviewed this encounter.              Lyndee Hensen, DO 12/21/22 1518

## 2022-12-21 NOTE — Discharge Instructions (Addendum)
Stop by the pharmacy to pick up your prescriptions.  Follow up with your primary care provider as needed.  

## 2022-12-21 NOTE — ED Triage Notes (Signed)
Patient states that he was playing ping pong on Monday night.  Patient states that he has a cut/abrasion on his right 5th finger.  Patient states that he has had redness, swelling and tenderness at the site.  Patient has been taking Ibuprofen.

## 2023-01-04 ENCOUNTER — Other Ambulatory Visit: Payer: Self-pay | Admitting: Internal Medicine

## 2023-01-04 DIAGNOSIS — I10 Essential (primary) hypertension: Secondary | ICD-10-CM

## 2023-01-04 NOTE — Telephone Encounter (Signed)
Requested Prescriptions  Pending Prescriptions Disp Refills   hydrochlorothiazide (HYDRODIURIL) 25 MG tablet [Pharmacy Med Name: HYDROCHLOROTHIAZIDE '25MG'$  TABLETS] 90 tablet 1    Sig: TAKE 1 TABLET(25 MG) BY MOUTH DAILY     Cardiovascular: Diuretics - Thiazide Passed - 01/04/2023  6:24 AM      Passed - Cr in normal range and within 180 days    Creatinine, Ser  Date Value Ref Range Status  09/27/2022 0.96 0.76 - 1.27 mg/dL Final         Passed - K in normal range and within 180 days    Potassium  Date Value Ref Range Status  09/27/2022 4.3 3.5 - 5.2 mmol/L Final         Passed - Na in normal range and within 180 days    Sodium  Date Value Ref Range Status  09/27/2022 139 134 - 144 mmol/L Final         Passed - Last BP in normal range    BP Readings from Last 1 Encounters:  12/21/22 132/89         Passed - Valid encounter within last 6 months    Recent Outpatient Visits           1 month ago Finger pain, right   [place] at [facility], Jesse Sans, MD   3 months ago Annual physical exam   [place] at [facility], Jesse Sans, MD   4 months ago Irritant contact dermatitis due to plants, except food   [place] at [facility], Jesse Sans, MD   4 months ago Irritable bowel syndrome with both constipation and diarrhea   [place] at [facility], Jesse Sans, MD   5 months ago Essential (primary) hypertension   [place] [facility], Jesse Sans, MD       Future Appointments             In 2 months [place] Melia, Jesse Sans, MD [place] at [facility]

## 2023-03-29 ENCOUNTER — Ambulatory Visit (INDEPENDENT_AMBULATORY_CARE_PROVIDER_SITE_OTHER): Payer: BC Managed Care – PPO | Admitting: Internal Medicine

## 2023-03-29 ENCOUNTER — Encounter: Payer: Self-pay | Admitting: Internal Medicine

## 2023-03-29 VITALS — BP 120/80 | HR 75 | Ht 68.0 in | Wt 175.0 lb

## 2023-03-29 DIAGNOSIS — I10 Essential (primary) hypertension: Secondary | ICD-10-CM | POA: Diagnosis not present

## 2023-03-29 DIAGNOSIS — N529 Male erectile dysfunction, unspecified: Secondary | ICD-10-CM

## 2023-03-29 DIAGNOSIS — K219 Gastro-esophageal reflux disease without esophagitis: Secondary | ICD-10-CM | POA: Diagnosis not present

## 2023-03-29 DIAGNOSIS — R7303 Prediabetes: Secondary | ICD-10-CM

## 2023-03-29 DIAGNOSIS — E785 Hyperlipidemia, unspecified: Secondary | ICD-10-CM

## 2023-03-29 LAB — POCT GLYCOSYLATED HEMOGLOBIN (HGB A1C): Hemoglobin A1C: 5.5 % (ref 4.0–5.6)

## 2023-03-29 MED ORDER — OMEPRAZOLE 20 MG PO CPDR: 20.0000 mg | DELAYED_RELEASE_CAPSULE | Freq: Every day | ORAL | 3 refills | Status: DC

## 2023-03-29 MED ORDER — TADALAFIL 10 MG PO TABS
10.0000 mg | ORAL_TABLET | ORAL | 1 refills | Status: DC | PRN
Start: 2023-03-29 — End: 2024-02-27

## 2023-03-29 NOTE — Progress Notes (Signed)
Date:  03/29/2023   Name:  Jacob Blair   DOB:  09-27-1971   MRN:  [id number]   Chief Complaint: Hypertension  Hypertension This is a chronic problem. The problem is controlled. Pertinent negatives include no chest pain, headaches, palpitations or shortness of breath. Past treatments include diuretics and angiotensin blockers. The current treatment provides significant improvement. There is no history of kidney disease, CAD/MI or CVA.  Diabetes He presents for his follow-up diabetic visit. Diabetes type: prediabetes. His disease course has been improving. Pertinent negatives for hypoglycemia include no dizziness, headaches or nervousness/anxiousness. Pertinent negatives for diabetes include no chest pain, no fatigue and no weakness. Pertinent negatives for diabetic complications include no CVA.  Erectile Dysfunction This is a recurrent problem. The problem has been waxing and waning since onset. The nature of his difficulty is maintaining erection. He reports no anxiety. Pertinent negatives include no chills. Past treatments include sildenafil (but this caused a headache).    Lab Results  Component Value Date   NA 139 09/27/2022   K 4.3 09/27/2022   CO2 25 09/27/2022   GLUCOSE 95 09/27/2022   BUN 13 09/27/2022   CREATININE 0.96 09/27/2022   CALCIUM 9.5 09/27/2022   EGFR 95 09/27/2022   GFRNONAA 84 01/22/2020   Lab Results  Component Value Date   CHOL 215 (H) 09/27/2022   HDL 49 09/27/2022   LDLCALC 144 (H) 09/27/2022   TRIG 121 09/27/2022   CHOLHDL 4.4 09/27/2022   Lab Results  Component Value Date   TSH 1.400 07/23/2016   Lab Results  Component Value Date   HGBA1C 5.5 03/29/2023   Lab Results  Component Value Date   WBC 6.1 09/27/2022   HGB 13.9 09/27/2022   HCT 40.5 09/27/2022   MCV 92 09/27/2022   PLT 334 09/27/2022   Lab Results  Component Value Date   ALT 26 09/27/2022   AST 21 09/27/2022   ALKPHOS 92 09/27/2022   BILITOT 0.5 09/27/2022   Lab  Results  Component Value Date   VD25OH 29.0 (L) 09/27/2022     Review of Systems  Constitutional:  Positive for unexpected weight change (has lost almost 20 lbs with diet changes). Negative for chills and fatigue.  HENT:  Negative for nosebleeds.   Eyes:  Negative for visual disturbance.  Respiratory:  Negative for cough, chest tightness, shortness of breath and wheezing.   Cardiovascular:  Negative for chest pain, palpitations and leg swelling.  Gastrointestinal:  Negative for abdominal pain, constipation and diarrhea.  Neurological:  Negative for dizziness, weakness, light-headedness and headaches.  Psychiatric/Behavioral:  Negative for dysphoric mood and sleep disturbance. The patient is not nervous/anxious.     Patient Active Problem List   Diagnosis Date Noted   Vitamin D deficiency 01/12/2022   Erectile dysfunction 01/12/2022   Polyp of descending colon    Hyperlipidemia, mild 07/24/2016   Low serum testosterone level 07/24/2016   Environmental and seasonal allergies 04/20/2015   Essential (primary) hypertension 04/20/2015   Gastro-esophageal reflux disease without esophagitis 04/20/2015   IBS (irritable bowel syndrome) 04/20/2015    Allergies  Allergen Reactions   Ace Inhibitors Cough   Peanut-Containing Drug Products    Amlodipine Rash   Shellfish Allergy Swelling and Rash    Crab, throat closure    Past Surgical History:  Procedure Laterality Date   COLONOSCOPY  12/12/2007   COLONOSCOPY WITH PROPOFOL N/A 05/16/2020   Procedure: COLONOSCOPY WITH PROPOFOL;  Surgeon: Midge Minium, MD;  Location: [facility];  Service: Endoscopy;  Laterality: N/A;  prioirty 4   POLYPECTOMY  05/16/2020   Procedure: POLYPECTOMY;  Surgeon: Midge Minium, MD;  Location: [facility];  Service: Endoscopy;;   UPPER GASTROINTESTINAL ENDOSCOPY  010209   small esophagus    Social History   Tobacco Use   Smoking status: Never   Smokeless tobacco: Former    Types: Scientific laboratory technician Use: Never used  Substance Use Topics   Alcohol use: Yes    Alcohol/week: 4.0 standard drinks of alcohol    Types: 4 Cans of beer per week    Comment: occasional   Drug use: No     Medication list has been reviewed and updated.  Current Meds  Medication Sig   fluticasone (FLONASE) 50 MCG/ACT nasal spray Place 2 sprays into both nostrils daily.   hydrochlorothiazide (HYDRODIURIL) 25 MG tablet TAKE 1 TABLET(25 MG) BY MOUTH DAILY   losartan (COZAAR) 100 MG tablet TAKE 1 TABLET(100 MG) BY MOUTH DAILY   tadalafil (CIALIS) 10 MG tablet Take 1 tablet (10 mg total) by mouth every other day as needed for erectile dysfunction.   VITAMIN D PO Take by mouth daily.   [DISCONTINUED] omeprazole (PRILOSEC) 20 MG capsule Take 1 capsule (20 mg total) by mouth daily.   [DISCONTINUED] sildenafil (VIAGRA) 100 MG tablet Take 1 tablet (100 mg total) by mouth daily as needed for erectile dysfunction.       03/29/2023   10:19 AM 11/26/2022    3:01 PM 09/27/2022    9:14 AM 08/22/2022    3:30 PM  GAD 7 : Generalized Anxiety Score  Nervous, Anxious, on Edge 0 0 0 0  Control/stop worrying 0 0 0 0  Worry too much - different things 0 0 0 0  Trouble relaxing 0 0 0 0  Restless 0 0 0 0  Easily annoyed or irritable 0 0 0 0  Afraid - awful might happen 0 0 0 0  Total GAD 7 Score 0 0 0 0  Anxiety Difficulty Not difficult at all Not difficult at all Not difficult at all Not difficult at all       03/29/2023   10:18 AM 11/26/2022    3:01 PM 09/27/2022    9:14 AM  Depression screen PHQ 2/9  Decreased Interest 0 0 0  Down, Depressed, Hopeless 0 0 0  PHQ - 2 Score 0 0 0  Altered sleeping 0 0 0  Tired, decreased energy 0 0 0  Change in appetite 0 0 0  Feeling bad or failure about yourself  0 0 0  Trouble concentrating 0 0 0  Moving slowly or fidgety/restless 0 0 0  Suicidal thoughts 0 0 0  PHQ-9 Score 0 0 0  Difficult doing work/chores Not difficult at all Not difficult at all Not  difficult at all    BP Readings from Last 3 Encounters:  03/29/23 120/80  12/21/22 132/89  11/26/22 128/76    Physical Exam Vitals and nursing note reviewed.  Constitutional:      General: He is not in acute distress.    Appearance: He is well-developed.  HENT:     Head: Normocephalic and atraumatic.  Cardiovascular:     Rate and Rhythm: Normal rate and regular rhythm.  Pulmonary:     Effort: Pulmonary effort is normal. No respiratory distress.  Musculoskeletal:        General: Normal range of motion.     Cervical back:  Normal range of motion.  Skin:    General: Skin is warm and dry.     Capillary Refill: Capillary refill takes less than 2 seconds.     Findings: No rash.  Neurological:     General: No focal deficit present.     Mental Status: He is alert and oriented to person, place, and time.  Psychiatric:        Mood and Affect: Mood normal.        Behavior: Behavior normal.     Wt Readings from Last 3 Encounters:  03/29/23 175 lb (79.4 kg)  12/21/22 178 lb (80.7 kg)  11/26/22 181 lb (82.1 kg)    BP 120/80   Pulse 75   Ht 5\' 8"  (1.727 m)   Wt 175 lb (79.4 kg)   SpO2 96%   BMI 26.61 kg/m   Assessment and Plan:  Problem List Items Addressed This Visit       Cardiovascular and Mediastinum   Essential (primary) hypertension - Primary (Chronic)    Clinically stable exam with well controlled BP on losartan and hctz. Tolerating medications without side effects. Pt to continue current regimen and low sodium diet.       Relevant Medications   tadalafil (CIALIS) 10 MG tablet     Digestive   Gastro-esophageal reflux disease without esophagitis (Chronic)   Relevant Medications   omeprazole (PRILOSEC) 20 MG capsule     Other   Erectile dysfunction   Relevant Medications   tadalafil (CIALIS) 10 MG tablet   Hyperlipidemia, mild (Chronic)    LDL is  Lab Results  Component Value Date   LDLCALC 144 (H) 09/27/2022  On diet alone.  10 yr risk below  average.        Relevant Medications   tadalafil (CIALIS) 10 MG tablet   Other Visit Diagnoses     Prediabetes       A1C is now back in normal range with diet and weight loss continue current regimen   Relevant Orders   POCT glycosylated hemoglobin (Hb A1C) (Completed)       Return in about 6 months (around 09/28/2023) for CPX.   Partially dictated using Dragon software, any errors are not intentional.  Reubin Milan, MD [facility] [place], [place]

## 2023-03-29 NOTE — Assessment & Plan Note (Signed)
Clinically stable exam with well controlled BP on losartan and hctz. Tolerating medications without side effects. Pt to continue current regimen and low sodium diet.  

## 2023-03-29 NOTE — Assessment & Plan Note (Signed)
LDL is  Lab Results  Component Value Date   LDLCALC 144 (H) 09/27/2022  On diet alone.  10 yr risk below average.

## 2023-07-03 ENCOUNTER — Other Ambulatory Visit: Payer: Self-pay | Admitting: Internal Medicine

## 2023-07-03 DIAGNOSIS — I10 Essential (primary) hypertension: Secondary | ICD-10-CM

## 2023-07-11 DIAGNOSIS — H35722 Serous detachment of retinal pigment epithelium, left eye: Secondary | ICD-10-CM | POA: Insufficient documentation

## 2023-07-11 DIAGNOSIS — H35412 Lattice degeneration of retina, left eye: Secondary | ICD-10-CM | POA: Diagnosis not present

## 2023-07-11 DIAGNOSIS — H348112 Central retinal vein occlusion, right eye, stable: Secondary | ICD-10-CM | POA: Insufficient documentation

## 2023-10-01 ENCOUNTER — Other Ambulatory Visit: Payer: Self-pay | Admitting: Internal Medicine

## 2023-10-01 DIAGNOSIS — I10 Essential (primary) hypertension: Secondary | ICD-10-CM

## 2023-10-10 ENCOUNTER — Encounter: Payer: BC Managed Care – PPO | Admitting: Internal Medicine

## 2023-11-18 ENCOUNTER — Telehealth: Payer: Self-pay | Admitting: Internal Medicine

## 2023-11-18 NOTE — Telephone Encounter (Signed)
Patient called due to being scheduled for virtual visit.  He complains of onset of diarrhea after a trip to [facility].  Is has gradually improved since coming home with regular diet and daily yogurt.  Initially stools were very loose and triggered by meals. Now more formed and without blood or mucus.  He has been taking imodium once a day. He has IBS-D and is up to date on colonoscopy. Recommend adding a probiotic, can continue imodium daily as needed.  Will cancel his appointment tomorrow,  he can make an in-person visit if needed in the near future.

## 2023-11-19 ENCOUNTER — Telehealth: Payer: BC Managed Care – PPO | Admitting: Internal Medicine

## 2023-12-13 ENCOUNTER — Telehealth (INDEPENDENT_AMBULATORY_CARE_PROVIDER_SITE_OTHER): Payer: BC Managed Care – PPO | Admitting: Internal Medicine

## 2023-12-13 ENCOUNTER — Encounter: Payer: Self-pay | Admitting: Internal Medicine

## 2023-12-13 VITALS — Ht 68.0 in | Wt 175.0 lb

## 2023-12-13 DIAGNOSIS — J069 Acute upper respiratory infection, unspecified: Secondary | ICD-10-CM

## 2023-12-13 DIAGNOSIS — J0100 Acute maxillary sinusitis, unspecified: Secondary | ICD-10-CM

## 2023-12-13 MED ORDER — AZITHROMYCIN 250 MG PO TABS
ORAL_TABLET | ORAL | 0 refills | Status: AC
Start: 2023-12-13 — End: 2023-12-18

## 2023-12-13 MED ORDER — PROMETHAZINE-DM 6.25-15 MG/5ML PO SYRP
5.0000 mL | ORAL_SOLUTION | Freq: Four times a day (QID) | ORAL | 0 refills | Status: AC | PRN
Start: 2023-12-13 — End: 2023-12-22

## 2023-12-13 NOTE — Patient Instructions (Signed)
 Coricidin HBP  Claritin or Allegra

## 2023-12-13 NOTE — Progress Notes (Signed)
 Date:  12/13/2023   Name:  Jacob Blair   DOB:  [birth year]   MRN:  [id number]  This encounter was conducted via video encounter. This platform was deemed appropriate for the issues to be addressed.  The patient was correctly identified.  I advised that I am conducting the visit from a secure room in my office at [facility] clinic.  The patient is located at home. The limitations of this form of encounter were discussed with the patient and he/she agreed to proceed.  Some vital signs will be absent.  Chief Complaint: Cough (Started 2 days ago. Wife had this and now he has symptoms. Congestion, sore throat, no SOB or fever. )  URI  This is a new problem. The current episode started yesterday. There has been no fever. Associated symptoms include coughing, headaches, sinus pain, a sore throat and swollen glands. Pertinent negatives include no chest pain, diarrhea, ear pain, vomiting or wheezing. The treatment provided mild relief.  Sinus Problem This is a new problem. The current episode started yesterday. There has been no fever. Associated symptoms include coughing, headaches, sinus pressure, a sore throat and swollen glands. Pertinent negatives include no chills, ear pain or shortness of breath.    Review of Systems  Constitutional:  Negative for chills and fever.  HENT:  Positive for sinus pressure, sinus pain and sore throat. Negative for ear pain.   Respiratory:  Positive for cough. Negative for shortness of breath and wheezing.   Cardiovascular:  Negative for chest pain.  Gastrointestinal:  Negative for diarrhea and vomiting.  Neurological:  Positive for headaches.  Psychiatric/Behavioral:  Negative for dysphoric mood and sleep disturbance. The patient is not nervous/anxious.      Lab Results  Component Value Date   NA 139 09/27/2022   K 4.3 09/27/2022   CO2 25 09/27/2022   GLUCOSE 95 09/27/2022   BUN 13 09/27/2022   CREATININE 0.96 09/27/2022   CALCIUM 9.5 09/27/2022    EGFR 95 09/27/2022   GFRNONAA 84 01/22/2020   Lab Results  Component Value Date   CHOL 215 (H) 09/27/2022   HDL 49 09/27/2022   LDLCALC 144 (H) 09/27/2022   TRIG 121 09/27/2022   CHOLHDL 4.4 09/27/2022   Lab Results  Component Value Date   TSH 1.400 07/23/2016   Lab Results  Component Value Date   HGBA1C 5.5 03/29/2023   Lab Results  Component Value Date   WBC 6.1 09/27/2022   HGB 13.9 09/27/2022   HCT 40.5 09/27/2022   MCV 92 09/27/2022   PLT 334 09/27/2022   Lab Results  Component Value Date   ALT 26 09/27/2022   AST 21 09/27/2022   ALKPHOS 92 09/27/2022   BILITOT 0.5 09/27/2022   Lab Results  Component Value Date   VD25OH 29.0 (L) 09/27/2022     Patient Active Problem List   Diagnosis Date Noted   Vitamin D  deficiency 01/12/2022   Erectile dysfunction 01/12/2022   Polyp of descending colon    Hyperlipidemia, mild 07/24/2016   Low serum testosterone  level 07/24/2016   Environmental and seasonal allergies 04/20/2015   Essential (primary) hypertension 04/20/2015   Gastro-esophageal reflux disease without esophagitis 04/20/2015   IBS (irritable bowel syndrome) 04/20/2015    Allergies  Allergen Reactions   Ace Inhibitors Cough   Peanut-Containing Drug Products    Amlodipine Rash   Shellfish Allergy Swelling and Rash    Crab, throat closure    Past Surgical History:  Procedure  Laterality Date   COLONOSCOPY  12/12/2007   COLONOSCOPY WITH PROPOFOL  N/A 05/16/2020   Procedure: COLONOSCOPY WITH PROPOFOL ;  Surgeon: Jinny Carmine, MD;  Location: [facility];  Service: Endoscopy;  Laterality: N/A;  prioirty 4   POLYPECTOMY  05/16/2020   Procedure: POLYPECTOMY;  Surgeon: Jinny Carmine, MD;  Location: [facility];  Service: Endoscopy;;   UPPER GASTROINTESTINAL ENDOSCOPY  010209   small esophagus    Social History   Tobacco Use   Smoking status: Never   Smokeless tobacco: Former    Types: Chew  Vaping Use   Vaping status: Never Used   Substance Use Topics   Alcohol use: Yes    Alcohol/week: 4.0 standard drinks of alcohol    Types: 4 Cans of beer per week    Comment: occasional   Drug use: No     Medication list has been reviewed and updated.  Current Meds  Medication Sig   azithromycin  (ZITHROMAX  Z-PAK) 250 MG tablet UAD   hydrochlorothiazide  (HYDRODIURIL ) 25 MG tablet TAKE 1 TABLET(25 MG) BY MOUTH DAILY   losartan  (COZAAR ) 100 MG tablet TAKE 1 TABLET(100 MG) BY MOUTH DAILY   omeprazole  (PRILOSEC) 20 MG capsule Take 1 capsule (20 mg total) by mouth daily.   promethazine -dextromethorphan (PROMETHAZINE -DM) 6.25-15 MG/5ML syrup Take 5 mLs by mouth 4 (four) times daily as needed for up to 9 days for cough.   tadalafil  (CIALIS ) 10 MG tablet Take 1 tablet (10 mg total) by mouth every other day as needed for erectile dysfunction.   VITAMIN D  PO Take by mouth daily.       12/13/2023    8:58 AM 03/29/2023   10:19 AM 11/26/2022    3:01 PM 09/27/2022    9:14 AM  GAD 7 : Generalized Anxiety Score  Nervous, Anxious, on Edge 0 0 0 0  Control/stop worrying 0 0 0 0  Worry too much - different things 0 0 0 0  Trouble relaxing 0 0 0 0  Restless 0 0 0 0  Easily annoyed or irritable 0 0 0 0  Afraid - awful might happen 0 0 0 0  Total GAD 7 Score 0 0 0 0  Anxiety Difficulty Not difficult at all Not difficult at all Not difficult at all Not difficult at all       12/13/2023    8:58 AM 03/29/2023   10:18 AM 11/26/2022    3:01 PM  Depression screen PHQ 2/9  Decreased Interest 0 0 0  Down, Depressed, Hopeless 0 0 0  PHQ - 2 Score 0 0 0  Altered sleeping 0 0 0  Tired, decreased energy 0 0 0  Change in appetite 0 0 0  Feeling bad or failure about yourself  0 0 0  Trouble concentrating 0 0 0  Moving slowly or fidgety/restless 0 0 0  Suicidal thoughts 0 0 0  PHQ-9 Score 0 0 0  Difficult doing work/chores Not difficult at all Not difficult at all Not difficult at all    BP Readings from Last 3 Encounters:  03/29/23  120/80  12/21/22 132/89  11/26/22 128/76    Physical Exam Constitutional:      Appearance: Normal appearance.  Pulmonary:     Effort: Pulmonary effort is normal.  Neurological:     General: No focal deficit present.     Mental Status: He is alert and oriented to person, place, and time.     Wt Readings from Last 3 Encounters:  12/13/23 175  lb (79.4 kg)  03/29/23 175 lb (79.4 kg)  12/21/22 178 lb (80.7 kg)    Ht 5' 8 (1.727 m)   Wt 175 lb (79.4 kg)   BMI 26.61 kg/m   Assessment and Plan:  Problem List Items Addressed This Visit   None Visit Diagnoses       Viral URI with cough    -  Primary   Relevant Medications   azithromycin  (ZITHROMAX  Z-PAK) 250 MG tablet   promethazine -dextromethorphan (PROMETHAZINE -DM) 6.25-15 MG/5ML syrup     Acute non-recurrent maxillary sinusitis       Relevant Medications   azithromycin  (ZITHROMAX  Z-PAK) 250 MG tablet   promethazine -dextromethorphan (PROMETHAZINE -DM) 6.25-15 MG/5ML syrup      I spent 8 minutes on this encounter, 100% by video. No follow-ups on file.    Leita HILARIO Adie, MD [facility] [place]

## 2023-12-16 ENCOUNTER — Encounter: Payer: Self-pay | Admitting: Internal Medicine

## 2023-12-17 ENCOUNTER — Ambulatory Visit (INDEPENDENT_AMBULATORY_CARE_PROVIDER_SITE_OTHER): Payer: BC Managed Care – PPO | Admitting: Internal Medicine

## 2023-12-17 VITALS — BP 122/74 | HR 95 | Temp 99.2°F | Ht 68.0 in | Wt 181.2 lb

## 2023-12-17 DIAGNOSIS — Z125 Encounter for screening for malignant neoplasm of prostate: Secondary | ICD-10-CM | POA: Diagnosis not present

## 2023-12-17 DIAGNOSIS — E785 Hyperlipidemia, unspecified: Secondary | ICD-10-CM

## 2023-12-17 DIAGNOSIS — E559 Vitamin D deficiency, unspecified: Secondary | ICD-10-CM

## 2023-12-17 DIAGNOSIS — Z0000 Encounter for general adult medical examination without abnormal findings: Secondary | ICD-10-CM

## 2023-12-17 DIAGNOSIS — K219 Gastro-esophageal reflux disease without esophagitis: Secondary | ICD-10-CM

## 2023-12-17 DIAGNOSIS — I10 Essential (primary) hypertension: Secondary | ICD-10-CM

## 2023-12-17 DIAGNOSIS — J040 Acute laryngitis: Secondary | ICD-10-CM

## 2023-12-17 NOTE — Assessment & Plan Note (Signed)
 Controlled BP with normal exam. Current regimen is losartan and hctz. Will continue same medications; encourage continued reduced sodium diet.

## 2023-12-17 NOTE — Assessment & Plan Note (Signed)
 Reflux symptoms are minimal on current therapy - omeprazole. No red flag signs such as weight loss, n/v, melena

## 2023-12-17 NOTE — Progress Notes (Signed)
 Date:  12/17/2023   Name:  Jacob Blair   DOB:  Jan 31, 1971   MRN:  [id number]   Chief Complaint: Annual Exam (Pt is also dealing with cold symptoms for about a week. He states he is on a Z pack and is about to run out.) Jacob Blair is a 53 y.o. male who presents today for his Complete Annual Exam. He feels fairly well due to cold symptoms. He reports exercising 3 to 4 days a week, but has slacked a little due to not feeling well because of cold. He reports he is not sleeping well due to cold.   Colonoscopy: 06/2020 repeat 5 yrs  Immunization History  Administered Date(s) Administered   Influenza Inj Mdck Quad Pf 01/16/2017, 11/28/2017   Influenza,inj,Quad PF,6+ Mos 10/15/2018, 09/27/2022   Influenza-Unspecified 09/10/2015, 11/28/2017   PFIZER Comirnaty(Gray Top)Covid-19 Tri-Sucrose Vaccine 03/13/2020, 04/19/2020   Tdap 12/21/2022   Health Maintenance Due  Topic Date Due   HIV Screening  Never done   Zoster Vaccines- Shingrix (1 of 2) Never done    Lab Results  Component Value Date   PSA1 0.9 09/27/2022   PSA1 1.1 05/03/2021   PSA1 1.2 01/22/2020   PSA 1.0 07/12/2014    Hypertension This is a chronic problem. The problem is controlled. Pertinent negatives include no chest pain, headaches, palpitations or shortness of breath.  Gastroesophageal Reflux He reports no abdominal pain, no chest pain, no coughing or no wheezing. Pertinent negatives include no fatigue.  URI - he has cough and sore throat - treated with Zpak day #5.  Sleeping okay with cough suppressant.  No production and no fever until this am - low grade.  No HA, ear pain, shortness of breath or wheezing.  Review of Systems  Constitutional:  Negative for fatigue and unexpected weight change.  HENT:  Positive for congestion, sinus pressure and voice change. Negative for nosebleeds.   Eyes:  Negative for visual disturbance.  Respiratory:  Negative for cough, chest tightness, shortness of breath and wheezing.    Cardiovascular:  Negative for chest pain, palpitations and leg swelling.  Gastrointestinal:  Negative for abdominal pain, constipation and diarrhea.  Neurological:  Negative for dizziness, weakness, light-headedness and headaches.  Psychiatric/Behavioral:  Negative for dysphoric mood and sleep disturbance. The patient is not nervous/anxious.      Lab Results  Component Value Date   NA 139 09/27/2022   K 4.3 09/27/2022   CO2 25 09/27/2022   GLUCOSE 95 09/27/2022   BUN 13 09/27/2022   CREATININE 0.96 09/27/2022   CALCIUM 9.5 09/27/2022   EGFR 95 09/27/2022   GFRNONAA 84 01/22/2020   Lab Results  Component Value Date   CHOL 215 (H) 09/27/2022   HDL 49 09/27/2022   LDLCALC 144 (H) 09/27/2022   TRIG 121 09/27/2022   CHOLHDL 4.4 09/27/2022   Lab Results  Component Value Date   TSH 1.400 07/23/2016   Lab Results  Component Value Date   HGBA1C 5.5 03/29/2023   Lab Results  Component Value Date   WBC 6.1 09/27/2022   HGB 13.9 09/27/2022   HCT 40.5 09/27/2022   MCV 92 09/27/2022   PLT 334 09/27/2022   Lab Results  Component Value Date   ALT 26 09/27/2022   AST 21 09/27/2022   ALKPHOS 92 09/27/2022   BILITOT 0.5 09/27/2022   Lab Results  Component Value Date   VD25OH 29.0 (L) 09/27/2022     Patient Active Problem List  Diagnosis Date Noted   Vitamin D  deficiency 01/12/2022   Erectile dysfunction 01/12/2022   Polyp of descending colon    Hyperlipidemia, mild 07/24/2016   Low serum testosterone  level 07/24/2016   Environmental and seasonal allergies 04/20/2015   Essential (primary) hypertension 04/20/2015   Gastro-esophageal reflux disease without esophagitis 04/20/2015   IBS (irritable bowel syndrome) 04/20/2015    Allergies  Allergen Reactions   Ace Inhibitors Cough   Peanut-Containing Drug Products    Amlodipine Rash   Shellfish Allergy Swelling and Rash    Crab, throat closure    Past Surgical History:  Procedure Laterality Date   COLONOSCOPY   12/12/2007   COLONOSCOPY WITH PROPOFOL  N/A 05/16/2020   Procedure: COLONOSCOPY WITH PROPOFOL ;  Surgeon: Jinny Carmine, MD;  Location: [facility];  Service: Endoscopy;  Laterality: N/A;  prioirty 4   POLYPECTOMY  05/16/2020   Procedure: POLYPECTOMY;  Surgeon: Jinny Carmine, MD;  Location: [facility];  Service: Endoscopy;;   UPPER GASTROINTESTINAL ENDOSCOPY  010209   small esophagus    Social History   Tobacco Use   Smoking status: Never   Smokeless tobacco: Former    Types: Chew  Vaping Use   Vaping status: Never Used  Substance Use Topics   Alcohol use: Yes    Alcohol/week: 4.0 standard drinks of alcohol    Types: 4 Cans of beer per week    Comment: occasional   Drug use: No     Medication list has been reviewed and updated.  Current Meds  Medication Sig   azithromycin  (ZITHROMAX  Z-PAK) 250 MG tablet UAD   hydrochlorothiazide  (HYDRODIURIL ) 25 MG tablet TAKE 1 TABLET(25 MG) BY MOUTH DAILY   losartan  (COZAAR ) 100 MG tablet TAKE 1 TABLET(100 MG) BY MOUTH DAILY   omeprazole  (PRILOSEC) 20 MG capsule Take 1 capsule (20 mg total) by mouth daily.   promethazine -dextromethorphan (PROMETHAZINE -DM) 6.25-15 MG/5ML syrup Take 5 mLs by mouth 4 (four) times daily as needed for up to 9 days for cough.   tadalafil  (CIALIS ) 10 MG tablet Take 1 tablet (10 mg total) by mouth every other day as needed for erectile dysfunction.   VITAMIN D  PO Take by mouth daily.       12/17/2023    8:06 AM 12/13/2023    8:58 AM 03/29/2023   10:19 AM 11/26/2022    3:01 PM  GAD 7 : Generalized Anxiety Score  Nervous, Anxious, on Edge 0 0 0 0  Control/stop worrying 0 0 0 0  Worry too much - different things 0 0 0 0  Trouble relaxing 0 0 0 0  Restless 0 0 0 0  Easily annoyed or irritable 0 0 0 0  Afraid - awful might happen 0 0 0 0  Total GAD 7 Score 0 0 0 0  Anxiety Difficulty Not difficult at all Not difficult at all Not difficult at all Not difficult at all       12/17/2023    8:06 AM  12/13/2023    8:58 AM 03/29/2023   10:18 AM  Depression screen PHQ 2/9  Decreased Interest 0 0 0  Down, Depressed, Hopeless 0 0 0  PHQ - 2 Score 0 0 0  Altered sleeping 0 0 0  Tired, decreased energy 0 0 0  Change in appetite 0 0 0  Feeling bad or failure about yourself  0 0 0  Trouble concentrating 0 0 0  Moving slowly or fidgety/restless 0 0 0  Suicidal thoughts 0 0 0  PHQ-9 Score 0 0 0  Difficult doing work/chores Not difficult at all Not difficult at all Not difficult at all    BP Readings from Last 3 Encounters:  12/17/23 122/74  03/29/23 120/80  12/21/22 132/89    Physical Exam Vitals and nursing note reviewed.  Constitutional:      Appearance: Normal appearance. He is well-developed.  HENT:     Head: Normocephalic.     Right Ear: Tympanic membrane, ear canal and external ear normal.     Left Ear: Tympanic membrane, ear canal and external ear normal.     Nose: Nose normal.     Right Sinus: No maxillary sinus tenderness or frontal sinus tenderness.     Left Sinus: No maxillary sinus tenderness or frontal sinus tenderness.     Mouth/Throat:     Pharynx: Posterior oropharyngeal erythema present. No oropharyngeal exudate.  Eyes:     Conjunctiva/sclera: Conjunctivae normal.     Pupils: Pupils are equal, round, and reactive to light.  Neck:     Thyroid: No thyromegaly.     Vascular: No carotid bruit.  Cardiovascular:     Rate and Rhythm: Normal rate and regular rhythm.     Heart sounds: Normal heart sounds.  Pulmonary:     Effort: Pulmonary effort is normal.     Breath sounds: Normal breath sounds. No wheezing.  Chest:  Breasts:    Right: No mass.     Left: No mass.  Abdominal:     General: Bowel sounds are normal.     Palpations: Abdomen is soft.     Tenderness: There is no abdominal tenderness.  Musculoskeletal:        General: Normal range of motion.     Cervical back: Normal range of motion and neck supple.     Right lower leg: No edema.     Left lower  leg: No edema.  Lymphadenopathy:     Cervical: No cervical adenopathy.  Skin:    General: Skin is warm and dry.  Neurological:     Mental Status: He is alert and oriented to person, place, and time.     Deep Tendon Reflexes: Reflexes are normal and symmetric.  Psychiatric:        Attention and Perception: Attention normal.        Mood and Affect: Mood normal.        Thought Content: Thought content normal.     Wt Readings from Last 3 Encounters:  12/17/23 181 lb 3.2 oz (82.2 kg)  12/13/23 175 lb (79.4 kg)  03/29/23 175 lb (79.4 kg)    BP 122/74   Pulse 95   Temp 99.2 F (37.3 C) (Oral)   Ht 5' 8 (1.727 m)   Wt 181 lb 3.2 oz (82.2 kg)   SpO2 97%   BMI 27.55 kg/m   Assessment and Plan:  Problem List Items Addressed This Visit       Unprioritized   Essential (primary) hypertension (Chronic)   Controlled BP with normal exam. Current regimen is losartan  and hctz. Will continue same medications; encourage continued reduced sodium diet.       Relevant Orders   CBC with Differential/Platelet   Comprehensive metabolic panel   Urinalysis, Routine w reflex microscopic   Gastro-esophageal reflux disease without esophagitis (Chronic)   Reflux symptoms are minimal on current therapy - omeprazole . No red flag signs such as weight loss, n/v, melena       Relevant Orders   CBC with  Differential/Platelet   Hyperlipidemia, mild (Chronic)   Relevant Orders   Lipid panel   Vitamin D  deficiency (Chronic)   Relevant Orders   VITAMIN D  25 Hydroxy (Vit-D Deficiency, Fractures)   Other Visit Diagnoses       Annual physical exam    -  Primary   Relevant Orders   CBC with Differential/Platelet   Comprehensive metabolic panel   Lipid panel   PSA   Urinalysis, Routine w reflex microscopic   VITAMIN D  25 Hydroxy (Vit-D Deficiency, Fractures)     Prostate cancer screening       Relevant Orders   PSA     Acute laryngitis       Finish Zpak to cover for bacterial  cause continue Advil, cough syrup, rest follow up if symtpoms are worsening       Return in about 6 months (around 06/15/2024) for HTN.    Leita HILARIO Adie, MD [facility] [place]

## 2023-12-18 LAB — CBC WITH DIFFERENTIAL/PLATELET
Basophils Absolute: 0.1 10*3/uL (ref 0.0–0.2)
Basos: 1 %
EOS (ABSOLUTE): 0.2 10*3/uL (ref 0.0–0.4)
Eos: 2 %
Hematocrit: 39.4 % (ref 37.5–51.0)
Hemoglobin: 13.2 g/dL (ref 13.0–17.7)
Immature Grans (Abs): 0.1 10*3/uL (ref 0.0–0.1)
Immature Granulocytes: 1 %
Lymphocytes Absolute: 1.9 10*3/uL (ref 0.7–3.1)
Lymphs: 18 %
MCH: 31.2 pg (ref 26.6–33.0)
MCHC: 33.5 g/dL (ref 31.5–35.7)
MCV: 93 fL (ref 79–97)
Monocytes Absolute: 0.9 10*3/uL (ref 0.1–0.9)
Monocytes: 8 %
Neutrophils Absolute: 7.7 10*3/uL — ABNORMAL HIGH (ref 1.4–7.0)
Neutrophils: 70 %
Platelets: 361 10*3/uL (ref 150–450)
RBC: 4.23 x10E6/uL (ref 4.14–5.80)
RDW: 12 % (ref 11.6–15.4)
WBC: 10.7 10*3/uL (ref 3.4–10.8)

## 2023-12-18 LAB — COMPREHENSIVE METABOLIC PANEL
ALT: 20 [IU]/L (ref 0–44)
AST: 18 [IU]/L (ref 0–40)
Albumin: 4.4 g/dL (ref 3.8–4.9)
Alkaline Phosphatase: 106 [IU]/L (ref 44–121)
BUN/Creatinine Ratio: 12 (ref 9–20)
BUN: 12 mg/dL (ref 6–24)
Bilirubin Total: 0.4 mg/dL (ref 0.0–1.2)
CO2: 25 mmol/L (ref 20–29)
Calcium: 9.7 mg/dL (ref 8.7–10.2)
Chloride: 101 mmol/L (ref 96–106)
Creatinine, Ser: 1.01 mg/dL (ref 0.76–1.27)
Globulin, Total: 2.9 g/dL (ref 1.5–4.5)
Glucose: 105 mg/dL — ABNORMAL HIGH (ref 70–99)
Potassium: 4.3 mmol/L (ref 3.5–5.2)
Sodium: 140 mmol/L (ref 134–144)
Total Protein: 7.3 g/dL (ref 6.0–8.5)
eGFR: 88 mL/min/{1.73_m2} (ref >59)

## 2023-12-18 LAB — PSA: Prostate Specific Ag, Serum: 1.4 ng/mL (ref 0.0–4.0)

## 2023-12-18 LAB — URINALYSIS, ROUTINE W REFLEX MICROSCOPIC
Bilirubin, UA: NEGATIVE
Glucose, UA: NEGATIVE
Ketones, UA: NEGATIVE
Leukocytes,UA: NEGATIVE
Nitrite, UA: NEGATIVE
Protein,UA: NEGATIVE
RBC, UA: NEGATIVE
Specific Gravity, UA: 1.021 (ref 1.005–1.030)
Urobilinogen, Ur: 0.2 mg/dL (ref 0.2–1.0)
pH, UA: 6 (ref 5.0–7.5)

## 2023-12-18 LAB — LIPID PANEL
Chol/HDL Ratio: 4.7 {ratio} (ref 0.0–5.0)
Cholesterol, Total: 200 mg/dL — ABNORMAL HIGH (ref 100–199)
HDL: 43 mg/dL (ref >39)
LDL Chol Calc (NIH): 135 mg/dL — ABNORMAL HIGH (ref 0–99)
Triglycerides: 119 mg/dL (ref 0–149)
VLDL Cholesterol Cal: 22 mg/dL (ref 5–40)

## 2023-12-18 LAB — VITAMIN D 25 HYDROXY (VIT D DEFICIENCY, FRACTURES): Vit D, 25-Hydroxy: 48.9 ng/mL (ref 30.0–100.0)

## 2024-01-07 ENCOUNTER — Telehealth: Payer: Self-pay | Admitting: Internal Medicine

## 2024-01-07 ENCOUNTER — Ambulatory Visit: Payer: Self-pay | Admitting: *Deleted

## 2024-01-07 NOTE — Telephone Encounter (Signed)
Message from Bryant E sent at 01/07/2024  1:49 PM EST  Summary: Seeking appt, nothing available   Pt has cold symptoms that he cannot get rid of, please advise. No appt available soon enough  Best contact: [phone]          Call History  Contact Date/Time Type Contact Phone/Fax By  01/07/2024 01:49 PM EST Phone (Incoming) Jacob Blair, Jacob Blair" (Self) [phone] Rexene Edison) Randol Kern   Reason for Disposition  Cough has been present for > 3 weeks    Seen for this on 12/17/2023.   On 12/13/2023 did a video visit for same symptoms.  Answer Assessment - Initial Assessment Questions 1. ONSET: "When did the cough begin?"      I was seen by Dr. Judithann Graves for upper respiratory infection symptoms and sinus congestion, etc.   She gave me an antibiotic and I got better but now I'm feeling bad again.    I never really got over this cold.   The cough got better but now it's back and I'm coughing up yellow mucus.   I can't get rid of the cough.   I started using Mucinix for the cough and congestion last night.   I did one dose last night before bed.   I've not taken any today.  It did help last night.   I'm starting to move some of the stuff in my chest.   It's yellow mucus.   No fever.   No sore throat now.   The cough has come back and is worse.   I have the sinus congestion and my ears feel stopped up.   It seems this has come back. 2. SEVERITY: "How bad is the cough today?"      I can't get rid of the cough.  I'm coughing up yellow mucus and coughing a lot especially when I talk and at night. 3. SPUTUM: "Describe the color of your sputum" (none, dry cough; clear, white, yellow, green)     Yellow 4. HEMOPTYSIS: "Are you coughing up any blood?" If so ask: "How much?" (flecks, streaks, tablespoons, etc.)     Not asked 5. DIFFICULTY BREATHING: "Are you having difficulty breathing?" If Yes, ask: "How bad is it?" (e.g., mild, moderate, severe)    - MILD: No SOB at rest, mild SOB with walking, speaks  normally in sentences, can lie down, no retractions, pulse < 100.    - MODERATE: SOB at rest, SOB with minimal exertion and prefers to sit, cannot lie down flat, speaks in phrases, mild retractions, audible wheezing, pulse 100-120.    - SEVERE: Very SOB at rest, speaks in single words, struggling to breathe, sitting hunched forward, retractions, pulse > 120      No 6. FEVER: "Do you have a fever?" If Yes, ask: "What is your temperature, how was it measured, and when did it start?"     No 7. CARDIAC HISTORY: "Do you have any history of heart disease?" (e.g., heart attack, congestive heart failure)      Not asked 8. LUNG HISTORY: "Do you have any history of lung disease?"  (e.g., pulmonary embolus, asthma, emphysema)     Not asked 9. PE RISK FACTORS: "Do you have a history of blood clots?" (or: recent major surgery, recent prolonged travel, bedridden)     Not asked 10. OTHER SYMPTOMS: "Do you have any other symptoms?" (e.g., runny nose, wheezing, chest pain)       Sinus congestion, ears feeling like they  are stopped up. I'm using the Navage nasal rinse system.   I'm so congested it takes it  a few seconds to go into my nose and back out compared to when I normally would do it. 11. PREGNANCY: "Is there any chance you are pregnant?" "When was your last menstrual period?"       N/A 12. TRAVEL: "Have you traveled out of the country in the last month?" (e.g., travel history, exposures)       Not asked  Protocols used: Cough - Acute Productive-A-AH

## 2024-01-07 NOTE — Telephone Encounter (Signed)
  Chief Complaint: Seen on 12/17/2023 and given an antibiotic for URI; coughing, sinus congestion, sore throat.   Antibiotic helped.  Cough never went away but did get better. Frequency: Now the cough has returned, coughing up yellow mucus, having sinus congestion again.  "It seems this cold is coming back".    Pertinent Negatives: Patient denies fever or sore throat now.    "I did get better after the antibiotic but now it feels like it's coming back". Disposition: [] ED /[] Urgent Care (no appt availability in office) / [] Appointment(In office/virtual)/ []  [facility]/ [] Home Care/ [] Refused Recommended Disposition /[] [facility]/ [x]  Follow-up with PCP Additional Notes: Since pt seen on 12/17/2023 message has been sent to Dr. Judithann Graves.   Pt. Agreeable to someone calling him back with her recommendation.

## 2024-01-07 NOTE — Telephone Encounter (Signed)
Copied from CRM [id number]. Topic: General - Other >> Jan 07, 2024  3:01 PM Albin Felling L wrote: Reason for CRM: Pt called back, pt informed of Chassidy's message. Pt states he will hold off on scheduling appointment right now and will call back to schedule if symptoms worsen.

## 2024-01-07 NOTE — Telephone Encounter (Signed)
Left voice mail

## 2024-02-11 ENCOUNTER — Ambulatory Visit: Payer: BC Managed Care – PPO | Admitting: Internal Medicine

## 2024-02-21 ENCOUNTER — Ambulatory Visit: Admitting: Internal Medicine

## 2024-02-27 ENCOUNTER — Ambulatory Visit (INDEPENDENT_AMBULATORY_CARE_PROVIDER_SITE_OTHER): Admitting: Internal Medicine

## 2024-02-27 ENCOUNTER — Encounter: Payer: Self-pay | Admitting: Internal Medicine

## 2024-02-27 VITALS — BP 118/76 | HR 68 | Ht 68.0 in | Wt 180.1 lb

## 2024-02-27 DIAGNOSIS — K219 Gastro-esophageal reflux disease without esophagitis: Secondary | ICD-10-CM

## 2024-02-27 DIAGNOSIS — H6992 Unspecified Eustachian tube disorder, left ear: Secondary | ICD-10-CM | POA: Diagnosis not present

## 2024-02-27 DIAGNOSIS — N529 Male erectile dysfunction, unspecified: Secondary | ICD-10-CM | POA: Diagnosis not present

## 2024-02-27 MED ORDER — TADALAFIL 10 MG PO TABS: 10.0000 mg | ORAL_TABLET | ORAL | 1 refills | Status: AC | PRN

## 2024-02-27 MED ORDER — OMEPRAZOLE 20 MG PO CPDR: 20.0000 mg | DELAYED_RELEASE_CAPSULE | Freq: Every day | ORAL | 3 refills | Status: AC

## 2024-02-27 NOTE — Patient Instructions (Signed)
 Coricidin HBP - take as needed for ear congestion.

## 2024-02-27 NOTE — Progress Notes (Signed)
 Date:  02/27/2024   Name:  Jacob Blair   DOB:  04-13-1971   MRN:  [id number]   Chief Complaint: Ear Pain (Patient said he has been having ear pain off and on for a few weeks, no drainage, no fevers, has tooth ache that thinks might be associated with it )  Otalgia  There is pain in the right ear. This is a new problem. The current episode started 1 to 4 weeks ago. The problem occurs constantly. Progression since onset: today it does not hurt. The pain is mild. Pertinent negatives include no ear discharge, headaches, hearing loss, neck pain or sore throat. He has tried nothing for the symptoms.    Review of Systems  Constitutional:  Negative for chills, fatigue and fever.  HENT:  Positive for dental problem (concern for cracked tooth on the same side) and ear pain. Negative for ear discharge, hearing loss and sore throat.   Respiratory:  Negative for chest tightness and shortness of breath.   Cardiovascular:  Negative for chest pain.  Musculoskeletal:  Negative for neck pain.  Neurological:  Negative for headaches.     Lab Results  Component Value Date   NA 140 12/17/2023   K 4.3 12/17/2023   CO2 25 12/17/2023   GLUCOSE 105 (H) 12/17/2023   BUN 12 12/17/2023   CREATININE 1.01 12/17/2023   CALCIUM 9.7 12/17/2023   EGFR 88 12/17/2023   GFRNONAA 84 01/22/2020   Lab Results  Component Value Date   CHOL 200 (H) 12/17/2023   HDL 43 12/17/2023   LDLCALC 135 (H) 12/17/2023   TRIG 119 12/17/2023   CHOLHDL 4.7 12/17/2023   Lab Results  Component Value Date   TSH 1.400 07/23/2016   Lab Results  Component Value Date   HGBA1C 5.5 03/29/2023   Lab Results  Component Value Date   WBC 10.7 12/17/2023   HGB 13.2 12/17/2023   HCT 39.4 12/17/2023   MCV 93 12/17/2023   PLT 361 12/17/2023   Lab Results  Component Value Date   ALT 20 12/17/2023   AST 18 12/17/2023   ALKPHOS 106 12/17/2023   BILITOT 0.4 12/17/2023   Lab Results  Component Value Date   VD25OH 48.9  12/17/2023     Patient Active Problem List   Diagnosis Date Noted   Vitamin D deficiency 01/12/2022   Erectile dysfunction 01/12/2022   Polyp of descending colon    Hyperlipidemia, mild 07/24/2016   Low serum testosterone level 07/24/2016   Environmental and seasonal allergies 04/20/2015   Essential (primary) hypertension 04/20/2015   Gastro-esophageal reflux disease without esophagitis 04/20/2015   IBS (irritable bowel syndrome) 04/20/2015    Allergies  Allergen Reactions   Ace Inhibitors Cough   Peanut-Containing Drug Products    Amlodipine Rash   Shellfish Allergy Swelling and Rash    Crab, throat closure    Past Surgical History:  Procedure Laterality Date   COLONOSCOPY  12/12/2007   COLONOSCOPY WITH PROPOFOL N/A 05/16/2020   Procedure: COLONOSCOPY WITH PROPOFOL;  Surgeon: Midge Minium, MD;  Location: [facility];  Service: Endoscopy;  Laterality: N/A;  prioirty 4   POLYPECTOMY  05/16/2020   Procedure: POLYPECTOMY;  Surgeon: Midge Minium, MD;  Location: [facility];  Service: Endoscopy;;   UPPER GASTROINTESTINAL ENDOSCOPY  010209   small esophagus    Social History   Tobacco Use   Smoking status: Never   Smokeless tobacco: Former    Types: Engineer, drilling  Vaping status: Never Used  Substance Use Topics   Alcohol use: Yes    Alcohol/week: 4.0 standard drinks of alcohol    Types: 4 Cans of beer per week    Comment: occasional   Drug use: No     Medication list has been reviewed and updated.  Current Meds  Medication Sig   hydrochlorothiazide (HYDRODIURIL) 25 MG tablet TAKE 1 TABLET(25 MG) BY MOUTH DAILY   losartan (COZAAR) 100 MG tablet TAKE 1 TABLET(100 MG) BY MOUTH DAILY   [DISCONTINUED] omeprazole (PRILOSEC) 20 MG capsule Take 1 capsule (20 mg total) by mouth daily.       02/27/2024   10:41 AM 12/17/2023    8:06 AM 12/13/2023    8:58 AM 03/29/2023   10:19 AM  GAD 7 : Generalized Anxiety Score  Nervous, Anxious, on Edge 0 0 0 0   Control/stop worrying 0 0 0 0  Worry too much - different things 0 0 0 0  Trouble relaxing 0 0 0 0  Restless 0 0 0 0  Easily annoyed or irritable 0 0 0 0  Afraid - awful might happen 0 0 0 0  Total GAD 7 Score 0 0 0 0  Anxiety Difficulty Not difficult at all Not difficult at all Not difficult at all Not difficult at all       02/27/2024   10:41 AM 12/17/2023    8:06 AM 12/13/2023    8:58 AM  Depression screen PHQ 2/9  Decreased Interest 0 0 0  Down, Depressed, Hopeless 0 0 0  PHQ - 2 Score 0 0 0  Altered sleeping 0 0 0  Tired, decreased energy 0 0 0  Change in appetite 0 0 0  Feeling bad or failure about yourself  0 0 0  Trouble concentrating 0 0 0  Moving slowly or fidgety/restless 0 0 0  Suicidal thoughts 0 0 0  PHQ-9 Score 0 0 0  Difficult doing work/chores Not difficult at all Not difficult at all Not difficult at all    BP Readings from Last 3 Encounters:  02/27/24 118/76  12/17/23 122/74  03/29/23 120/80    Physical Exam Constitutional:      Appearance: Normal appearance. He is well-developed.  HENT:     Right Ear: Ear canal and external ear normal. Tympanic membrane is retracted. Tympanic membrane is not erythematous.     Left Ear: Ear canal and external ear normal. Tympanic membrane is retracted. Tympanic membrane is not erythematous.     Nose:     Right Sinus: No maxillary sinus tenderness or frontal sinus tenderness.     Left Sinus: No maxillary sinus tenderness or frontal sinus tenderness.     Mouth/Throat:     Mouth: No oral lesions.     Pharynx: Uvula midline. No oropharyngeal exudate or posterior oropharyngeal erythema.  Cardiovascular:     Rate and Rhythm: Normal rate and regular rhythm.     Heart sounds: Normal heart sounds.  Pulmonary:     Breath sounds: Normal breath sounds. No wheezing or rales.  Lymphadenopathy:     Cervical: No cervical adenopathy.  Neurological:     Mental Status: He is alert and oriented to person, place, and time.      Wt Readings from Last 3 Encounters:  02/27/24 180 lb 2 oz (81.7 kg)  12/17/23 181 lb 3.2 oz (82.2 kg)  12/13/23 175 lb (79.4 kg)    BP 118/76   Pulse 68   Ht 5'  8" (1.727 m)   Wt 180 lb 2 oz (81.7 kg)   SpO2 97%   BMI 27.39 kg/m   Assessment and Plan:  Problem List Items Addressed This Visit       Unprioritized   Gastro-esophageal reflux disease without esophagitis (Chronic)   Relevant Medications   omeprazole (PRILOSEC) 20 MG capsule   Erectile dysfunction   Relevant Medications   tadalafil (CIALIS) 10 MG tablet   Other Visit Diagnoses       Eustachian tube dysfunction, left    -  Primary   recommend Coricidin HBP since he has HTN and can not use Flonase due to eye disease       No follow-ups on file.    Reubin Milan, MD [facility] [place]

## 2024-03-31 ENCOUNTER — Telehealth (INDEPENDENT_AMBULATORY_CARE_PROVIDER_SITE_OTHER): Admitting: Internal Medicine

## 2024-03-31 ENCOUNTER — Encounter: Payer: Self-pay | Admitting: Internal Medicine

## 2024-03-31 DIAGNOSIS — L03012 Cellulitis of left finger: Secondary | ICD-10-CM

## 2024-03-31 MED ORDER — DOXYCYCLINE HYCLATE 100 MG PO TABS: 100.0000 mg | ORAL_TABLET | Freq: Two times a day (BID) | ORAL | 0 refills | Status: AC

## 2024-03-31 NOTE — Progress Notes (Signed)
 Date:  03/31/2024   Name:  Jacob Blair   DOB:  25-Feb-1971   MRN:  [id number]  This encounter was conducted via video encounter. This platform was deemed appropriate for the issues to be addressed.  The patient was correctly identified.  I advised that I am conducting the visit from a secure room in my office at [facility] clinic.  The patient is located at home. The limitations of this form of encounter were discussed with the patient and he/she agreed to proceed.  Some vital signs will be absent.  Chief Complaint: Wound Infection (Left Ring finger, swollen on the knuckle, thinks it might be infected, redness, there is a scab on it, happened a couple weeks ago) He injured his left ring finger PIP joint at home last week.  He is not sure what caused the injury but it seemed to be healing.  Over the weekend he did a lot of yard work and re-opened the lesion.  Yesterday it seemed better but his knuckle was a bit swollen and he could not put his wedding ring on.  Today it is less swollen but more red around the area.    HPI  Review of Systems  Constitutional:  Negative for fatigue and fever.  Respiratory:  Negative for chest tightness and shortness of breath.   Cardiovascular:  Negative for chest pain.  Musculoskeletal:  Positive for joint swelling. Negative for arthralgias and gait problem.  Skin:  Positive for color change and wound. Negative for rash.  Psychiatric/Behavioral:  Negative for sleep disturbance.      Lab Results  Component Value Date   NA 140 12/17/2023   K 4.3 12/17/2023   CO2 25 12/17/2023   GLUCOSE 105 (H) 12/17/2023   BUN 12 12/17/2023   CREATININE 1.01 12/17/2023   CALCIUM 9.7 12/17/2023   EGFR 88 12/17/2023   GFRNONAA 84 01/22/2020   Lab Results  Component Value Date   CHOL 200 (H) 12/17/2023   HDL 43 12/17/2023   LDLCALC 135 (H) 12/17/2023   TRIG 119 12/17/2023   CHOLHDL 4.7 12/17/2023   Lab Results  Component Value Date   TSH 1.400 07/23/2016    Lab Results  Component Value Date   HGBA1C 5.5 03/29/2023   Lab Results  Component Value Date   WBC 10.7 12/17/2023   HGB 13.2 12/17/2023   HCT 39.4 12/17/2023   MCV 93 12/17/2023   PLT 361 12/17/2023   Lab Results  Component Value Date   ALT 20 12/17/2023   AST 18 12/17/2023   ALKPHOS 106 12/17/2023   BILITOT 0.4 12/17/2023   Lab Results  Component Value Date   VD25OH 48.9 12/17/2023     Patient Active Problem List   Diagnosis Date Noted   Vitamin D  deficiency 01/12/2022   Erectile dysfunction 01/12/2022   Polyp of descending colon    Hyperlipidemia, mild 07/24/2016   Low serum testosterone  level 07/24/2016   Environmental and seasonal allergies 04/20/2015   Essential (primary) hypertension 04/20/2015   Gastro-esophageal reflux disease without esophagitis 04/20/2015   IBS (irritable bowel syndrome) 04/20/2015    Allergies  Allergen Reactions   Ace Inhibitors Cough   Peanut-Containing Drug Products    Amlodipine Rash   Shellfish Allergy Swelling and Rash    Crab, throat closure    Past Surgical History:  Procedure Laterality Date   COLONOSCOPY  12/12/2007   COLONOSCOPY WITH PROPOFOL  N/A 05/16/2020   Procedure: COLONOSCOPY WITH PROPOFOL ;  Surgeon: Marnee Sink,  MD;  Location: [facility];  Service: Endoscopy;  Laterality: N/A;  prioirty 4   POLYPECTOMY  05/16/2020   Procedure: POLYPECTOMY;  Surgeon: Marnee Sink, MD;  Location: [facility];  Service: Endoscopy;;   UPPER GASTROINTESTINAL ENDOSCOPY  010209   small esophagus    Social History   Tobacco Use   Smoking status: Never   Smokeless tobacco: Former    Types: Chew  Vaping Use   Vaping status: Never Used  Substance Use Topics   Alcohol use: Yes    Alcohol/week: 4.0 standard drinks of alcohol    Types: 4 Cans of beer per week    Comment: occasional   Drug use: No     Medication list has been reviewed and updated.  Current Meds  Medication Sig   doxycycline  (VIBRA -TABS)  100 MG tablet Take 1 tablet (100 mg total) by mouth 2 (two) times daily for 10 days.   hydrochlorothiazide  (HYDRODIURIL ) 25 MG tablet TAKE 1 TABLET(25 MG) BY MOUTH DAILY   losartan  (COZAAR ) 100 MG tablet TAKE 1 TABLET(100 MG) BY MOUTH DAILY   omeprazole  (PRILOSEC) 20 MG capsule Take 1 capsule (20 mg total) by mouth daily.   tadalafil  (CIALIS ) 10 MG tablet Take 1 tablet (10 mg total) by mouth every other day as needed for erectile dysfunction.       03/31/2024    3:50 PM 02/27/2024   10:41 AM 12/17/2023    8:06 AM 12/13/2023    8:58 AM  GAD 7 : Generalized Anxiety Score  Nervous, Anxious, on Edge 0 0 0 0  Control/stop worrying 0 0 0 0  Worry too much - different things 0 0 0 0  Trouble relaxing 0 0 0 0  Restless 0 0 0 0  Easily annoyed or irritable 0 0 0 0  Afraid - awful might happen  0 0 0  Total GAD 7 Score  0 0 0  Anxiety Difficulty Not difficult at all Not difficult at all Not difficult at all Not difficult at all       03/31/2024    3:50 PM 02/27/2024   10:41 AM 12/17/2023    8:06 AM  Depression screen PHQ 2/9  Decreased Interest 0 0 0  Down, Depressed, Hopeless 0 0 0  PHQ - 2 Score 0 0 0  Altered sleeping 0 0 0  Tired, decreased energy 0 0 0  Change in appetite 0 0 0  Feeling bad or failure about yourself  0 0 0  Trouble concentrating 0 0 0  Moving slowly or fidgety/restless 0 0 0  Suicidal thoughts 0 0 0  PHQ-9 Score 0 0 0  Difficult doing work/chores Not difficult at all Not difficult at all Not difficult at all    BP Readings from Last 3 Encounters:  02/27/24 118/76  12/17/23 122/74  03/29/23 120/80    Physical Exam Vitals and nursing note reviewed.  Constitutional:      General: He is not in acute distress.    Appearance: He is well-developed.  HENT:     Head: Normocephalic and atraumatic.  Pulmonary:     Effort: Pulmonary effort is normal. No respiratory distress.  Musculoskeletal:     Comments: Mild redness and swelling dorsum of the left fourth PIP  joint.  Neurological:     Mental Status: He is alert and oriented to person, place, and time.  Psychiatric:        Mood and Affect: Mood normal.  Behavior: Behavior normal.     Wt Readings from Last 3 Encounters:  02/27/24 180 lb 2 oz (81.7 kg)  12/17/23 181 lb 3.2 oz (82.2 kg)  12/13/23 175 lb (79.4 kg)    There were no vitals taken for this visit.  Assessment and Plan:  Problem List Items Addressed This Visit   None Visit Diagnoses       Cellulitis of finger of left hand    -  Primary   possible retained FB clean daily; apply loose bandage for protection follow up if no improvement after antibiotics Tdap UTD   Relevant Medications   doxycycline  (VIBRA -TABS) 100 MG tablet      I spent 8 minutes on this encounter, 100% by video. No follow-ups on file.    Sheron Dixons, MD [facility] [place]

## 2024-06-15 ENCOUNTER — Ambulatory Visit: Payer: Self-pay | Admitting: Internal Medicine

## 2024-06-15 ENCOUNTER — Other Ambulatory Visit: Payer: Self-pay | Admitting: Internal Medicine

## 2024-06-15 DIAGNOSIS — I10 Essential (primary) hypertension: Secondary | ICD-10-CM

## 2024-06-15 MED ORDER — HYDROCHLOROTHIAZIDE 25 MG PO TABS: ORAL_TABLET | ORAL | 3 refills | Status: DC

## 2024-06-15 NOTE — Progress Notes (Unsigned)
 Date:  06/15/2024   Name:  Jacob Blair   DOB:  04/24/1971   MRN:  [id number]   Chief Complaint: No chief complaint on file.  HPI  Review of Systems   Lab Results  Component Value Date   NA 140 12/17/2023   K 4.3 12/17/2023   CO2 25 12/17/2023   GLUCOSE 105 (H) 12/17/2023   BUN 12 12/17/2023   CREATININE 1.01 12/17/2023   CALCIUM 9.7 12/17/2023   EGFR 88 12/17/2023   GFRNONAA 84 01/22/2020   Lab Results  Component Value Date   CHOL 200 (H) 12/17/2023   HDL 43 12/17/2023   LDLCALC 135 (H) 12/17/2023   TRIG 119 12/17/2023   CHOLHDL 4.7 12/17/2023   Lab Results  Component Value Date   TSH 1.400 07/23/2016   Lab Results  Component Value Date   HGBA1C 5.5 03/29/2023   Lab Results  Component Value Date   WBC 10.7 12/17/2023   HGB 13.2 12/17/2023   HCT 39.4 12/17/2023   MCV 93 12/17/2023   PLT 361 12/17/2023   Lab Results  Component Value Date   ALT 20 12/17/2023   AST 18 12/17/2023   ALKPHOS 106 12/17/2023   BILITOT 0.4 12/17/2023   Lab Results  Component Value Date   VD25OH 48.9 12/17/2023     Patient Active Problem List   Diagnosis Date Noted   Vitamin D  deficiency 01/12/2022   Erectile dysfunction 01/12/2022   Polyp of descending colon    Hyperlipidemia, mild 07/24/2016   Low serum testosterone  level 07/24/2016   Environmental and seasonal allergies 04/20/2015   Essential (primary) hypertension 04/20/2015   Gastro-esophageal reflux disease without esophagitis 04/20/2015   IBS (irritable bowel syndrome) 04/20/2015    Allergies  Allergen Reactions   Ace Inhibitors Cough   Peanut-Containing Drug Products    Amlodipine Rash   Shellfish Allergy Swelling and Rash    Crab, throat closure    Past Surgical History:  Procedure Laterality Date   COLONOSCOPY  12/12/2007   COLONOSCOPY WITH PROPOFOL  N/A 05/16/2020   Procedure: COLONOSCOPY WITH PROPOFOL ;  Surgeon: Jinny Carmine, MD;  Location: [facility];  Service: Endoscopy;  Laterality:  N/A;  prioirty 4   POLYPECTOMY  05/16/2020   Procedure: POLYPECTOMY;  Surgeon: Jinny Carmine, MD;  Location: [facility];  Service: Endoscopy;;   UPPER GASTROINTESTINAL ENDOSCOPY  010209   small esophagus    Social History   Tobacco Use   Smoking status: Never   Smokeless tobacco: Former    Types: Chew  Vaping Use   Vaping status: Never Used  Substance Use Topics   Alcohol use: Yes    Alcohol/week: 4.0 standard drinks of alcohol    Types: 4 Cans of beer per week    Comment: occasional   Drug use: No     Medication list has been reviewed and updated.  No outpatient medications have been marked as taking for the 06/15/24 encounter (Orders Only) with Justus Leita DEL, MD.       03/31/2024    3:50 PM 02/27/2024   10:41 AM 12/17/2023    8:06 AM 12/13/2023    8:58 AM  GAD 7 : Generalized Anxiety Score  Nervous, Anxious, on Edge 0 0 0 0  Control/stop worrying 0 0 0 0  Worry too much - different things 0 0 0 0  Trouble relaxing 0 0 0 0  Restless 0 0 0 0  Easily annoyed or irritable 0 0 0 0  Afraid - awful might happen  0 0 0  Total GAD 7 Score  0 0 0  Anxiety Difficulty Not difficult at all Not difficult at all Not difficult at all Not difficult at all       03/31/2024    3:50 PM 02/27/2024   10:41 AM 12/17/2023    8:06 AM  Depression screen PHQ 2/9  Decreased Interest 0 0 0  Down, Depressed, Hopeless 0 0 0  PHQ - 2 Score 0 0 0  Altered sleeping 0 0 0  Tired, decreased energy 0 0 0  Change in appetite 0 0 0  Feeling bad or failure about yourself  0 0 0  Trouble concentrating 0 0 0  Moving slowly or fidgety/restless 0 0 0  Suicidal thoughts 0 0 0  PHQ-9 Score 0 0 0  Difficult doing work/chores Not difficult at all Not difficult at all Not difficult at all    BP Readings from Last 3 Encounters:  02/27/24 118/76  12/17/23 122/74  03/29/23 120/80    Physical Exam  Wt Readings from Last 3 Encounters:  02/27/24 180 lb 2 oz (81.7 kg)  12/17/23 181 lb 3.2 oz  (82.2 kg)  12/13/23 175 lb (79.4 kg)    There were no vitals taken for this visit.  Assessment and Plan:  Problem List Items Addressed This Visit   None   No follow-ups on file.    Leita HILARIO Adie, MD [facility] [place]

## 2024-06-22 ENCOUNTER — Telehealth: Payer: Self-pay

## 2024-06-22 NOTE — Telephone Encounter (Signed)
 Appt canceled last week due to weather for HTN. Pls call and reschedule office visit with Dr Justus.  - Ariv Penrod M.

## 2024-07-01 ENCOUNTER — Encounter: Payer: Self-pay | Admitting: Internal Medicine

## 2024-07-01 ENCOUNTER — Ambulatory Visit
Admission: RE | Admit: 2024-07-01 | Discharge: 2024-07-01 | Disposition: A | Discharge status: Home / Self Care | Source: Ambulatory Visit | Attending: Internal Medicine | Admitting: Internal Medicine

## 2024-07-01 ENCOUNTER — Ambulatory Visit (INDEPENDENT_AMBULATORY_CARE_PROVIDER_SITE_OTHER): Admitting: Internal Medicine

## 2024-07-01 ENCOUNTER — Ambulatory Visit
Admission: RE | Admit: 2024-07-01 | Discharge: 2024-07-01 | Disposition: A | Discharge status: Home / Self Care | Attending: Internal Medicine | Admitting: Internal Medicine

## 2024-07-01 VITALS — BP 118/76 | HR 66 | Ht 68.0 in | Wt 182.8 lb

## 2024-07-01 DIAGNOSIS — M25461 Effusion, right knee: Secondary | ICD-10-CM | POA: Diagnosis not present

## 2024-07-01 DIAGNOSIS — M25561 Pain in right knee: Secondary | ICD-10-CM | POA: Diagnosis not present

## 2024-07-01 DIAGNOSIS — G8929 Other chronic pain: Secondary | ICD-10-CM | POA: Diagnosis not present

## 2024-07-01 DIAGNOSIS — R21 Rash and other nonspecific skin eruption: Secondary | ICD-10-CM

## 2024-07-01 DIAGNOSIS — I10 Essential (primary) hypertension: Secondary | ICD-10-CM

## 2024-07-01 NOTE — Assessment & Plan Note (Signed)
 Ongoing mild symptoms in the right knee Minimal pain but some stiffness and fullness Recommend imaging to better assess status Start joint supplement - OsteoBioflex daily

## 2024-07-01 NOTE — Patient Instructions (Signed)
 Osteobioflex with boswellia - knee joint supplement

## 2024-07-01 NOTE — Progress Notes (Signed)
 Date:  07/01/2024   Name:  Jacob Blair   DOB:  16-Apr-1971   MRN:  [id number]   Chief Complaint: Hypertension (Patient f/u for HTN. He has been taking medications as directed. ) and Poison Ivy (Patient believes he has poison ivy on his left side of torso x 2 weeks. Has improved but is still there.)  Hypertension This is a chronic problem. The problem is controlled. Pertinent negatives include no chest pain, headaches, palpitations or shortness of breath. Past treatments include angiotensin blockers and diuretics. The current treatment provides significant improvement.  Poison Porter This is a new problem. Episode onset: 2 weeks ago. The problem has been gradually improving since onset. The affected locations include the abdomen. Pertinent negatives include no cough, diarrhea, fatigue or shortness of breath.  Knee Pain  There was no injury mechanism. The pain is present in the right knee. The quality of the pain is described as aching. The pain is mild. The pain has been Fluctuating since onset. Pertinent negatives include no inability to bear weight, muscle weakness, numbness or tingling. The symptoms are aggravated by movement. He has tried NSAIDs for the symptoms. The treatment provided moderate relief.    Review of Systems  Constitutional:  Negative for fatigue and unexpected weight change.  HENT:  Negative for nosebleeds.   Eyes:  Negative for visual disturbance.  Respiratory:  Negative for cough, chest tightness, shortness of breath and wheezing.   Cardiovascular:  Negative for chest pain, palpitations and leg swelling.  Gastrointestinal:  Negative for abdominal pain, constipation and diarrhea.  Musculoskeletal:  Positive for arthralgias and joint swelling. Negative for gait problem.  Neurological:  Negative for dizziness, tingling, weakness, light-headedness, numbness and headaches.  Psychiatric/Behavioral:  Negative for dysphoric mood and sleep disturbance. The patient is not  nervous/anxious.      Lab Results  Component Value Date   NA 140 12/17/2023   K 4.3 12/17/2023   CO2 25 12/17/2023   GLUCOSE 105 (H) 12/17/2023   BUN 12 12/17/2023   CREATININE 1.01 12/17/2023   CALCIUM 9.7 12/17/2023   EGFR 88 12/17/2023   GFRNONAA 84 01/22/2020   Lab Results  Component Value Date   CHOL 200 (H) 12/17/2023   HDL 43 12/17/2023   LDLCALC 135 (H) 12/17/2023   TRIG 119 12/17/2023   CHOLHDL 4.7 12/17/2023   Lab Results  Component Value Date   TSH 1.400 07/23/2016   Lab Results  Component Value Date   HGBA1C 5.5 03/29/2023   Lab Results  Component Value Date   WBC 10.7 12/17/2023   HGB 13.2 12/17/2023   HCT 39.4 12/17/2023   MCV 93 12/17/2023   PLT 361 12/17/2023   Lab Results  Component Value Date   ALT 20 12/17/2023   AST 18 12/17/2023   ALKPHOS 106 12/17/2023   BILITOT 0.4 12/17/2023   Lab Results  Component Value Date   VD25OH 48.9 12/17/2023     Patient Active Problem List   Diagnosis Date Noted   Chronic pain of right knee 07/01/2024   Polyp of descending colon    Hyperlipidemia, mild 07/24/2016   Low serum testosterone  level 07/24/2016   Environmental and seasonal allergies 04/20/2015   Essential (primary) hypertension 04/20/2015   Gastro-esophageal reflux disease without esophagitis 04/20/2015   IBS (irritable bowel syndrome) 04/20/2015    Allergies  Allergen Reactions   Ace Inhibitors Cough   Peanut-Containing Drug Products    Amlodipine Rash   Shellfish Allergy Swelling and Rash  Crab, throat closure    Past Surgical History:  Procedure Laterality Date   COLONOSCOPY  12/12/2007   COLONOSCOPY WITH PROPOFOL  N/A 05/16/2020   Procedure: COLONOSCOPY WITH PROPOFOL ;  Surgeon: Jinny Carmine, MD;  Location: [facility];  Service: Endoscopy;  Laterality: N/A;  prioirty 4   POLYPECTOMY  05/16/2020   Procedure: POLYPECTOMY;  Surgeon: Jinny Carmine, MD;  Location: [facility];  Service: Endoscopy;;   UPPER  GASTROINTESTINAL ENDOSCOPY  010209   small esophagus    Social History   Tobacco Use   Smoking status: Never   Smokeless tobacco: Former    Types: Chew  Vaping Use   Vaping status: Never Used  Substance Use Topics   Alcohol use: Yes    Alcohol/week: 4.0 standard drinks of alcohol    Types: 4 Cans of beer per week    Comment: occasional   Drug use: No     Medication list has been reviewed and updated.  Current Meds  Medication Sig   hydrochlorothiazide  (HYDRODIURIL ) 25 MG tablet TAKE 1 TABLET(25 MG) BY MOUTH DAILY   losartan  (COZAAR ) 100 MG tablet TAKE 1 TABLET(100 MG) BY MOUTH DAILY   omeprazole  (PRILOSEC) 20 MG capsule Take 1 capsule (20 mg total) by mouth daily.   tadalafil  (CIALIS ) 10 MG tablet Take 1 tablet (10 mg total) by mouth every other day as needed for erectile dysfunction.   VITAMIN D  PO Take by mouth daily.       07/01/2024    4:03 PM 03/31/2024    3:50 PM 02/27/2024   10:41 AM 12/17/2023    8:06 AM  GAD 7 : Generalized Anxiety Score  Nervous, Anxious, on Edge 0 0 0 0  Control/stop worrying 0 0 0 0  Worry too much - different things 0 0 0 0  Trouble relaxing 0 0 0 0  Restless 0 0 0 0  Easily annoyed or irritable 0 0 0 0  Afraid - awful might happen 0  0 0  Total GAD 7 Score 0  0 0  Anxiety Difficulty Not difficult at all Not difficult at all Not difficult at all Not difficult at all       07/01/2024    4:03 PM 03/31/2024    3:50 PM 02/27/2024   10:41 AM  Depression screen PHQ 2/9  Decreased Interest 0 0 0  Down, Depressed, Hopeless 0 0 0  PHQ - 2 Score 0 0 0  Altered sleeping 0 0 0  Tired, decreased energy 0 0 0  Change in appetite 0 0 0  Feeling bad or failure about yourself  0 0 0  Trouble concentrating 0 0 0  Moving slowly or fidgety/restless 0 0 0  Suicidal thoughts 0 0 0  PHQ-9 Score 0 0 0  Difficult doing work/chores Not difficult at all Not difficult at all Not difficult at all    BP Readings from Last 3 Encounters:  07/01/24 118/76   02/27/24 118/76  12/17/23 122/74    Physical Exam Vitals and nursing note reviewed.  Constitutional:      General: He is not in acute distress.    Appearance: Normal appearance. He is well-developed.  HENT:     Head: Normocephalic and atraumatic.  Cardiovascular:     Rate and Rhythm: Normal rate and regular rhythm.     Heart sounds: No murmur heard. Pulmonary:     Effort: Pulmonary effort is normal. No respiratory distress.     Breath sounds: No wheezing or  rhonchi.  Musculoskeletal:     Right knee: Effusion present. No erythema, ecchymosis, bony tenderness or crepitus. Normal range of motion. No tenderness.     Left knee: Normal.     Right lower leg: No edema.     Left lower leg: No edema.  Lymphadenopathy:     Cervical: No cervical adenopathy.  Skin:    General: Skin is warm and dry.     Findings: No rash.         Comments: Macular red rash without drainage or bleeding  Neurological:     Mental Status: He is alert and oriented to person, place, and time.  Psychiatric:        Mood and Affect: Mood normal.        Behavior: Behavior normal.     Wt Readings from Last 3 Encounters:  07/01/24 182 lb 12.8 oz (82.9 kg)  02/27/24 180 lb 2 oz (81.7 kg)  12/17/23 181 lb 3.2 oz (82.2 kg)    BP 118/76   Pulse 66   Ht 5' 8 (1.727 m)   Wt 182 lb 12.8 oz (82.9 kg)   SpO2 99%   BMI 27.79 kg/m   Assessment and Plan:  Problem List Items Addressed This Visit       Unprioritized   Chronic pain of right knee   Ongoing mild symptoms in the right knee Minimal pain but some stiffness and fullness Recommend imaging to better assess status Start joint supplement - OsteoBioflex daily      Relevant Orders   DG Knee 3 Views Right   Essential (primary) hypertension - Primary (Chronic)   Blood pressure is well controlled.  Current medications are losartan  and hydrochlorothiazide . Will continue same regimen along with efforts to limit dietary sodium.       Other Visit  Diagnoses       Rash and other nonspecific skin eruption       c/w poison ivy continue topical steroid cream as needed.       Return in about 6 months (around 01/01/2025) for CPX with Rolan.    Leita HILARIO Adie, MD [facility] [place]

## 2024-07-01 NOTE — Assessment & Plan Note (Signed)
 Blood pressure is well controlled.  Current medications are losartan and hydrochlorothiazide. Will continue same regimen along with efforts to limit dietary sodium.

## 2024-07-02 ENCOUNTER — Ambulatory Visit: Payer: Self-pay | Admitting: Internal Medicine

## 2024-07-22 ENCOUNTER — Other Ambulatory Visit: Payer: Self-pay | Admitting: Medical Genetics

## 2024-08-21 ENCOUNTER — Other Ambulatory Visit

## 2024-09-04 ENCOUNTER — Other Ambulatory Visit

## 2024-09-25 ENCOUNTER — Other Ambulatory Visit

## 2024-09-28 ENCOUNTER — Other Ambulatory Visit: Payer: Self-pay | Admitting: Internal Medicine

## 2024-09-28 DIAGNOSIS — I10 Essential (primary) hypertension: Secondary | ICD-10-CM

## 2024-09-29 NOTE — Telephone Encounter (Signed)
 Requested medications are due for refill today.  yes  Requested medications are on the active medications list.  yes  Last refill. 10/01/2023 #90 3 rf  Future visit scheduled.   yes  Notes to clinic.  Labs are expired.    Requested Prescriptions  Pending Prescriptions Disp Refills   losartan  (COZAAR ) 100 MG tablet [Pharmacy Med Name: LOSARTAN  100MG  TABLETS] 90 tablet 3    Sig: TAKE 1 TABLET(100 MG) BY MOUTH DAILY     Cardiovascular:  Angiotensin Receptor Blockers Failed - 09/29/2024  3:28 PM      Failed - Cr in normal range and within 180 days    Creatinine, Ser  Date Value Ref Range Status  12/17/2023 1.01 0.76 - 1.27 mg/dL Final         Failed - K in normal range and within 180 days    Potassium  Date Value Ref Range Status  12/17/2023 4.3 3.5 - 5.2 mmol/L Final         Passed - Patient is not pregnant      Passed - Last BP in normal range    BP Readings from Last 1 Encounters:  07/01/24 118/76         Passed - Valid encounter within last 6 months    Recent Outpatient Visits           3 months ago Essential (primary) hypertension   [facility] at [facility], Leita DEL, MD   6 months ago Cellulitis of finger of left hand   [facility] at [facility], Leita DEL, MD   7 months ago Eustachian tube dysfunction, left   [facility] at [facility], Leita DEL, MD

## 2024-10-12 ENCOUNTER — Other Ambulatory Visit: Payer: Self-pay | Admitting: Medical Genetics

## 2024-10-12 DIAGNOSIS — Z006 Encounter for examination for normal comparison and control in clinical research program: Secondary | ICD-10-CM

## 2025-01-06 ENCOUNTER — Encounter: Payer: Self-pay | Admitting: Physician Assistant

## 2025-01-06 ENCOUNTER — Ambulatory Visit (INDEPENDENT_AMBULATORY_CARE_PROVIDER_SITE_OTHER): Admitting: Physician Assistant

## 2025-01-06 VITALS — BP 122/82 | HR 64 | Temp 98.0°F | Ht 68.0 in | Wt 183.0 lb

## 2025-01-06 DIAGNOSIS — R7303 Prediabetes: Secondary | ICD-10-CM | POA: Diagnosis not present

## 2025-01-06 DIAGNOSIS — Z125 Encounter for screening for malignant neoplasm of prostate: Secondary | ICD-10-CM

## 2025-01-06 DIAGNOSIS — E785 Hyperlipidemia, unspecified: Secondary | ICD-10-CM | POA: Diagnosis not present

## 2025-01-06 DIAGNOSIS — E559 Vitamin D deficiency, unspecified: Secondary | ICD-10-CM

## 2025-01-06 DIAGNOSIS — Z2821 Immunization not carried out because of patient refusal: Secondary | ICD-10-CM | POA: Diagnosis not present

## 2025-01-06 DIAGNOSIS — Z79899 Other long term (current) drug therapy: Secondary | ICD-10-CM | POA: Diagnosis not present

## 2025-01-06 DIAGNOSIS — I10 Essential (primary) hypertension: Secondary | ICD-10-CM

## 2025-01-06 DIAGNOSIS — Z0000 Encounter for general adult medical examination without abnormal findings: Secondary | ICD-10-CM | POA: Diagnosis not present

## 2025-01-06 MED ORDER — LOSARTAN POTASSIUM 100 MG PO TABS: ORAL_TABLET | ORAL | 1 refills | Status: AC

## 2025-01-06 MED ORDER — HYDROCHLOROTHIAZIDE 25 MG PO TABS: ORAL_TABLET | ORAL | 1 refills | Status: AC

## 2025-01-06 NOTE — Assessment & Plan Note (Addendum)
 Well-controlled with current regimen, no changes.  Orders:   Lipid panel   CBC with Differential/Platelet   Comprehensive metabolic panel with GFR   TSH   hydrochlorothiazide  (HYDRODIURIL ) 25 MG tablet; TAKE 1 TABLET(25 MG) BY MOUTH DAILY   losartan  (COZAAR ) 100 MG tablet; TAKE 1 TABLET(100 MG) BY MOUTH DAILY

## 2025-01-06 NOTE — Progress Notes (Signed)
 "   Date:  01/06/2025   Name:  Jacob Blair   DOB:  12/13/1970   MRN:  [id number]   Chief Complaint: Transitions Of Care  HPI  Jacob Blair is a pleasant 54 year old male with history of HTN, mild HLD, low testosterone , and GERD here for routine physical exam and as transfer of care from my recently retired colleague Dr. Leita Adie. He maintains an active lifestyle and is not currently on TRT - does not feel the need for it at this time.   Last Physical: 12/17/2023 Last Dental Exam: 25m ago Last Eye Exam: 2y ago Last CRC screen: July 2021, adenomatous polyp, repeat 5 years Last PSA: 12/17/2023 was 1.4 Immunizations Due: Flu, Prevnar 20, Shingrix  Medication list has been reviewed and updated.  Active Medications[1]   Review of Systems  Patient Active Problem List   Diagnosis Date Noted   Chronic pain of right knee 07/01/2024   Central retinal vein occlusion, right eye, stable (HCC) 07/11/2023   Serous detachment of retinal pigment epithelium of left eye 07/11/2023   Polyp of descending colon    Hyperlipidemia, mild 07/24/2016   Low serum testosterone  level 07/24/2016   Environmental and seasonal allergies 04/20/2015   Essential (primary) hypertension 04/20/2015   Gastro-esophageal reflux disease without esophagitis 04/20/2015   IBS (irritable bowel syndrome) 04/20/2015    Allergies[2]  Immunization History  Administered Date(s) Administered   Influenza Inj Mdck Quad Pf 01/16/2017, 11/28/2017   Influenza,inj,Quad PF,6+ Mos 10/15/2018, 09/27/2022   Influenza-Unspecified 09/10/2015, 11/28/2017   PFIZER Comirnaty(Gray Top)Covid-19 Tri-Sucrose Vaccine 03/13/2020, 04/19/2020   Tdap 12/21/2022    Past Surgical History:  Procedure Laterality Date   COLONOSCOPY  12/12/2007   COLONOSCOPY WITH PROPOFOL  N/A 05/16/2020   Procedure: COLONOSCOPY WITH PROPOFOL ;  Surgeon: Jinny Carmine, MD;  Location: [facility];  Service: Endoscopy;  Laterality: N/A;  prioirty 4   POLYPECTOMY   05/16/2020   Procedure: POLYPECTOMY;  Surgeon: Jinny Carmine, MD;  Location: [facility];  Service: Endoscopy;;   UPPER GASTROINTESTINAL ENDOSCOPY  010209   small esophagus    Social History[3]  Family History  Adopted: Yes        01/06/2025    9:01 AM 07/01/2024    4:03 PM 03/31/2024    3:50 PM 02/27/2024   10:41 AM  GAD 7 : Generalized Anxiety Score  Nervous, Anxious, on Edge 0 0  0  0   Control/stop worrying 0 0  0  0   Worry too much - different things 0 0  0  0   Trouble relaxing 0 0  0  0   Restless 0 0  0  0   Easily annoyed or irritable 0 0  0  0   Afraid - awful might happen 0 0   0   Total GAD 7 Score 0 0  0  Anxiety Difficulty Not difficult at all Not difficult at all Not difficult at all Not difficult at all     Data saved with a previous flowsheet row definition       01/06/2025    9:01 AM 07/01/2024    4:03 PM 03/31/2024    3:50 PM  Depression screen PHQ 2/9  Decreased Interest 0 0 0  Down, Depressed, Hopeless 0 0 0  PHQ - 2 Score 0 0 0  Altered sleeping  0 0  Tired, decreased energy  0 0  Change in appetite  0 0  Feeling bad or failure about yourself  0 0  Trouble concentrating  0 0  Moving slowly or fidgety/restless  0 0  Suicidal thoughts  0 0  PHQ-9 Score  0  0   Difficult doing work/chores  Not difficult at all Not difficult at all     Data saved with a previous flowsheet row definition    BP Readings from Last 3 Encounters:  01/06/25 122/82  07/01/24 118/76  02/27/24 118/76    Wt Readings from Last 3 Encounters:  01/06/25 183 lb (83 kg)  07/01/24 182 lb 12.8 oz (82.9 kg)  02/27/24 180 lb 2 oz (81.7 kg)    BP 122/82   Pulse 64   Temp 98 F (36.7 C)   Ht 5' 8 (1.727 m)   Wt 183 lb (83 kg)   SpO2 98%   BMI 27.83 kg/m   Physical Exam Vitals and nursing note reviewed.  Constitutional:      Appearance: Normal appearance.  HENT:     Ears:     Comments: EAC clear bilaterally with good view of TM which is without effusion or  erythema.     Nose: Nose normal.     Mouth/Throat:     Mouth: Mucous membranes are moist. No oral lesions.     Dentition: Normal dentition.     Pharynx: No posterior oropharyngeal erythema.  Eyes:     Extraocular Movements: Extraocular movements intact.     Conjunctiva/sclera: Conjunctivae normal.     Pupils: Pupils are equal, round, and reactive to light.  Neck:     Thyroid: No thyromegaly.  Cardiovascular:     Rate and Rhythm: Normal rate and regular rhythm.     Heart sounds: No murmur heard.    No friction rub. No gallop.     Comments: Pulses 2+ at radial, PT, DP bilaterally. No carotid bruit. No peripheral edema Pulmonary:     Effort: Pulmonary effort is normal.     Breath sounds: Normal breath sounds.  Abdominal:     General: Bowel sounds are normal.     Palpations: Abdomen is soft. There is no mass.     Tenderness: There is no abdominal tenderness.  Genitourinary:    Comments: Genital/rectal exam deferred.  Musculoskeletal:     Comments: Full ROM with strength 5/5 bilateral upper and lower extremities  Lymphadenopathy:     Cervical: No cervical adenopathy.  Skin:    General: Skin is warm.     Capillary Refill: Capillary refill takes less than 2 seconds.     Findings: No lesion or rash.  Neurological:     Mental Status: He is alert and oriented to person, place, and time.     Gait: Gait is intact.  Psychiatric:        Mood and Affect: Mood normal.        Behavior: Behavior normal.     Recent Labs     Component Value Date/Time   NA 140 12/17/2023 0830   K 4.3 12/17/2023 0830   CL 101 12/17/2023 0830   CO2 25 12/17/2023 0830   GLUCOSE 105 (H) 12/17/2023 0830   GLUCOSE 93 07/27/2017 1516   BUN 12 12/17/2023 0830   CREATININE 1.01 12/17/2023 0830   CALCIUM 9.7 12/17/2023 0830   PROT 7.3 12/17/2023 0830   ALBUMIN 4.4 12/17/2023 0830   AST 18 12/17/2023 0830   ALT 20 12/17/2023 0830   ALKPHOS 106 12/17/2023 0830   BILITOT 0.4 12/17/2023 0830   GFRNONAA 84  01/22/2020 0837  GFRAA 97 01/22/2020 0837    Lab Results  Component Value Date   WBC 10.7 12/17/2023   HGB 13.2 12/17/2023   HCT 39.4 12/17/2023   MCV 93 12/17/2023   PLT 361 12/17/2023   Lab Results  Component Value Date   HGBA1C 5.5 03/29/2023   HGBA1C 6.0 (H) 09/27/2022   Lab Results  Component Value Date   CHOL 200 (H) 12/17/2023   HDL 43 12/17/2023   LDLCALC 135 (H) 12/17/2023   TRIG 119 12/17/2023   CHOLHDL 4.7 12/17/2023   Lab Results  Component Value Date   TSH 1.400 07/23/2016        Assessment & Plan Annual physical exam Encouraged healthy lifestyle including regular physical activity and consumption of whole fruits and vegetables. Encouraged routine dental and eye exams.   Orders:   Lipid panel   PSA   CBC with Differential/Platelet   Comprehensive metabolic panel with GFR   TSH   Vitamin B12   VITAMIN D  25 Hydroxy (Vit-D Deficiency, Fractures)  Immunization declined Declines all vaccinations today    Essential (primary) hypertension Well-controlled with current regimen, no changes.  Orders:   Lipid panel   CBC with Differential/Platelet   Comprehensive metabolic panel with GFR   TSH   hydrochlorothiazide  (HYDRODIURIL ) 25 MG tablet; TAKE 1 TABLET(25 MG) BY MOUTH DAILY   losartan  (COZAAR ) 100 MG tablet; TAKE 1 TABLET(100 MG) BY MOUTH DAILY  Hyperlipidemia, mild Check fasting lipids today Orders:   Lipid panel  Prediabetes Check A1c, last was 5.5% but prior 6.0% Orders:   Hemoglobin A1c  Long-term current use of proton pump inhibitor therapy Takes PPI daily, will assess for B12 def Orders:   Vitamin B12  Vitamin D  deficiency Check Vit D, has not been as good about supplementing recently. We reviewed sometimes this is not covered by insurance and he acknowledges.  Orders:   VITAMIN D  25 Hydroxy (Vit-D Deficiency, Fractures)  Screening PSA (prostate specific antigen) Declined prostate exam today but will check PSA.  Orders:    PSA    F/u 60m OV HTN F/u 1y CPE   Rolan Hoyle, PA-C, DMSc, DipACLM, Nutritionist [place] [facility] [phone]      [1]  Current Meds  Medication Sig   hydrochlorothiazide  (HYDRODIURIL ) 25 MG tablet TAKE 1 TABLET(25 MG) BY MOUTH DAILY   losartan  (COZAAR ) 100 MG tablet TAKE 1 TABLET(100 MG) BY MOUTH DAILY   omeprazole  (PRILOSEC) 20 MG capsule Take 1 capsule (20 mg total) by mouth daily.   Pyridoxine HCl (VITAMIN B6 PO) Take by mouth.   tadalafil  (CIALIS ) 10 MG tablet Take 1 tablet (10 mg total) by mouth every other day as needed for erectile dysfunction.   VITAMIN D  PO Take by mouth daily.  [2]  Allergies Allergen Reactions   Ace Inhibitors Cough   Peanut-Containing Drug Products    Amlodipine Rash   Shellfish Allergy Swelling and Rash    Crab, throat closure  [3]  Social History Tobacco Use   Smoking status: Never   Smokeless tobacco: Former    Types: Engineer, Drilling   Vaping status: Never Used  Substance Use Topics   Alcohol use: Yes    Alcohol/week: 4.0 standard drinks of alcohol    Types: 4 Cans of beer per week    Comment: occasional   Drug use: No   "

## 2025-01-06 NOTE — Assessment & Plan Note (Addendum)
 Check fasting lipids today Orders:   Lipid panel

## 2025-01-07 ENCOUNTER — Ambulatory Visit: Payer: Self-pay | Admitting: Physician Assistant

## 2025-01-07 LAB — LIPID PANEL
Chol/HDL Ratio: 4.4 ratio (ref 0.0–5.0)
Cholesterol, Total: 231 mg/dL — ABNORMAL HIGH (ref 100–199)
HDL: 52 mg/dL
LDL Chol Calc (NIH): 160 mg/dL — ABNORMAL HIGH (ref 0–99)
Triglycerides: 106 mg/dL (ref 0–149)
VLDL Cholesterol Cal: 19 mg/dL (ref 5–40)

## 2025-01-07 LAB — COMPREHENSIVE METABOLIC PANEL WITH GFR
ALT: 24 [IU]/L (ref 0–44)
AST: 23 [IU]/L (ref 0–40)
Albumin: 4.7 g/dL (ref 3.8–4.9)
Alkaline Phosphatase: 88 [IU]/L (ref 47–123)
BUN/Creatinine Ratio: 14 (ref 9–20)
BUN: 14 mg/dL (ref 6–24)
Bilirubin Total: 0.5 mg/dL (ref 0.0–1.2)
CO2: 25 mmol/L (ref 20–29)
Calcium: 9.8 mg/dL (ref 8.7–10.2)
Chloride: 100 mmol/L (ref 96–106)
Creatinine, Ser: 1.01 mg/dL (ref 0.76–1.27)
Globulin, Total: 2.6 g/dL (ref 1.5–4.5)
Glucose: 92 mg/dL (ref 70–99)
Potassium: 4.6 mmol/L (ref 3.5–5.2)
Sodium: 140 mmol/L (ref 134–144)
Total Protein: 7.3 g/dL (ref 6.0–8.5)
eGFR: 88 mL/min/{1.73_m2}

## 2025-01-07 LAB — CBC WITH DIFFERENTIAL/PLATELET
Basophils Absolute: 0 10*3/uL (ref 0.0–0.2)
Basos: 1 %
EOS (ABSOLUTE): 0.2 10*3/uL (ref 0.0–0.4)
Eos: 4 %
Hematocrit: 43.4 % (ref 37.5–51.0)
Hemoglobin: 14.2 g/dL (ref 13.0–17.7)
Immature Grans (Abs): 0 10*3/uL (ref 0.0–0.1)
Immature Granulocytes: 0 %
Lymphocytes Absolute: 1.6 10*3/uL (ref 0.7–3.1)
Lymphs: 30 %
MCH: 31.3 pg (ref 26.6–33.0)
MCHC: 32.7 g/dL (ref 31.5–35.7)
MCV: 96 fL (ref 79–97)
Monocytes Absolute: 0.4 10*3/uL (ref 0.1–0.9)
Monocytes: 8 %
Neutrophils Absolute: 3 10*3/uL (ref 1.4–7.0)
Neutrophils: 57 %
Platelets: 317 10*3/uL (ref 150–450)
RBC: 4.53 x10E6/uL (ref 4.14–5.80)
RDW: 12.3 % (ref 11.6–15.4)
WBC: 5.2 10*3/uL (ref 3.4–10.8)

## 2025-01-07 LAB — HEMOGLOBIN A1C
Est. average glucose Bld gHb Est-mCnc: 123 mg/dL
Hgb A1c MFr Bld: 5.9 % — ABNORMAL HIGH (ref 4.8–5.6)

## 2025-01-07 LAB — TSH: TSH: 1.35 u[IU]/mL (ref 0.450–4.500)

## 2025-01-07 LAB — PSA: Prostate Specific Ag, Serum: 1.2 ng/mL (ref 0.0–4.0)

## 2025-01-07 LAB — VITAMIN B12: Vitamin B-12: >2000 pg/mL — ABNORMAL HIGH (ref 232–1245)

## 2025-01-07 LAB — VITAMIN D 25 HYDROXY (VIT D DEFICIENCY, FRACTURES): Vit D, 25-Hydroxy: 30.7 ng/mL (ref 30.0–100.0)

## 2025-07-06 ENCOUNTER — Ambulatory Visit: Admitting: Physician Assistant

## 2026-01-07 ENCOUNTER — Encounter: Admitting: Physician Assistant
